# Patient Record
Sex: Female | Born: 1935 | Hispanic: No | Smoking: Never smoker
Health system: Southern US, Community
[De-identification: ages and names within clinical notes are randomized; demographics above are authoritative.]

## PROBLEM LIST (undated history)

## (undated) DIAGNOSIS — Z992 Dependence on renal dialysis: Secondary | ICD-10-CM

## (undated) DIAGNOSIS — N289 Disorder of kidney and ureter, unspecified: Secondary | ICD-10-CM

## (undated) DIAGNOSIS — M810 Age-related osteoporosis without current pathological fracture: Secondary | ICD-10-CM

## (undated) DIAGNOSIS — H409 Unspecified glaucoma: Secondary | ICD-10-CM

## (undated) DIAGNOSIS — IMO0002 Reserved for concepts with insufficient information to code with codable children: Secondary | ICD-10-CM

## (undated) DIAGNOSIS — H3530 Unspecified macular degeneration: Secondary | ICD-10-CM

## (undated) DIAGNOSIS — I4891 Unspecified atrial fibrillation: Secondary | ICD-10-CM

## (undated) DIAGNOSIS — I719 Aortic aneurysm of unspecified site, without rupture: Secondary | ICD-10-CM

## (undated) DIAGNOSIS — I509 Heart failure, unspecified: Secondary | ICD-10-CM

## (undated) DIAGNOSIS — J811 Chronic pulmonary edema: Secondary | ICD-10-CM

## (undated) DIAGNOSIS — I517 Cardiomegaly: Secondary | ICD-10-CM

## (undated) DIAGNOSIS — I10 Essential (primary) hypertension: Secondary | ICD-10-CM

## (undated) HISTORY — PX: PACEMAKER PLACEMENT: SHX43

## (undated) HISTORY — PX: EYE SURGERY: SHX253

---

## 2010-03-11 DIAGNOSIS — J811 Chronic pulmonary edema: Secondary | ICD-10-CM

## 2010-03-11 HISTORY — DX: Chronic pulmonary edema: J81.1

## 2010-04-10 ENCOUNTER — Ambulatory Visit: Payer: Self-pay | Admitting: Nephrology

## 2010-04-11 ENCOUNTER — Other Ambulatory Visit: Payer: Self-pay | Admitting: Nephrology

## 2010-04-11 ENCOUNTER — Ambulatory Visit: Payer: Self-pay | Admitting: Internal Medicine

## 2010-04-12 ENCOUNTER — Observation Stay: Payer: Self-pay | Admitting: Nephrology

## 2010-04-16 ENCOUNTER — Inpatient Hospital Stay: Payer: Self-pay | Admitting: Internal Medicine

## 2010-04-21 DIAGNOSIS — R0602 Shortness of breath: Secondary | ICD-10-CM

## 2010-05-10 ENCOUNTER — Ambulatory Visit: Payer: Self-pay | Admitting: Internal Medicine

## 2010-05-18 ENCOUNTER — Ambulatory Visit: Payer: Self-pay | Admitting: Internal Medicine

## 2010-06-10 ENCOUNTER — Ambulatory Visit: Payer: Self-pay | Admitting: Internal Medicine

## 2010-06-28 ENCOUNTER — Ambulatory Visit: Payer: Self-pay | Admitting: Family Medicine

## 2010-06-29 ENCOUNTER — Ambulatory Visit: Payer: Self-pay | Admitting: Vascular Surgery

## 2010-06-29 ENCOUNTER — Ambulatory Visit: Payer: Self-pay | Admitting: Family Medicine

## 2010-07-01 ENCOUNTER — Emergency Department: Payer: Self-pay | Admitting: Emergency Medicine

## 2010-07-18 ENCOUNTER — Encounter: Payer: Self-pay | Admitting: Family Medicine

## 2010-07-27 ENCOUNTER — Ambulatory Visit: Payer: Self-pay | Admitting: Vascular Surgery

## 2010-07-31 ENCOUNTER — Emergency Department: Payer: Self-pay | Admitting: Emergency Medicine

## 2010-08-04 ENCOUNTER — Ambulatory Visit: Payer: Self-pay

## 2010-08-10 ENCOUNTER — Encounter: Payer: Self-pay | Admitting: Family Medicine

## 2010-08-22 ENCOUNTER — Ambulatory Visit: Payer: Self-pay

## 2010-10-18 ENCOUNTER — Inpatient Hospital Stay: Payer: Self-pay | Admitting: Internal Medicine

## 2010-11-07 ENCOUNTER — Ambulatory Visit: Payer: Self-pay | Admitting: Internal Medicine

## 2012-08-20 LAB — CBC WITH DIFFERENTIAL/PLATELET
Basophil %: 1.7 %
Eosinophil %: 2.2 %
HGB: 10.7 g/dL — ABNORMAL LOW (ref 12.0–16.0)
Lymphocyte #: 1.8 10*3/uL (ref 1.0–3.6)
Lymphocyte %: 25 %
MCH: 32.8 pg (ref 26.0–34.0)
MCV: 99 fL (ref 80–100)
Monocyte #: 0.5 x10 3/mm (ref 0.2–0.9)
Neutrophil #: 4.7 10*3/uL (ref 1.4–6.5)
Neutrophil %: 64.5 %
Platelet: 160 10*3/uL (ref 150–440)
RBC: 3.26 10*6/uL — ABNORMAL LOW (ref 3.80–5.20)
RDW: 14.2 % (ref 11.5–14.5)
WBC: 7.2 10*3/uL (ref 3.6–11.0)

## 2012-08-20 LAB — COMPREHENSIVE METABOLIC PANEL
Alkaline Phosphatase: 86 U/L (ref 50–136)
BUN: 54 mg/dL — ABNORMAL HIGH (ref 7–18)
Bilirubin,Total: 0.5 mg/dL (ref 0.2–1.0)
Calcium, Total: 9.2 mg/dL (ref 8.5–10.1)
Chloride: 103 mmol/L (ref 98–107)
Co2: 26 mmol/L (ref 21–32)
Creatinine: 6.84 mg/dL — ABNORMAL HIGH (ref 0.60–1.30)
EGFR (African American): 6 — ABNORMAL LOW
Osmolality: 293 (ref 275–301)
Potassium: 4.4 mmol/L (ref 3.5–5.1)
SGPT (ALT): 26 U/L (ref 12–78)
Sodium: 139 mmol/L (ref 136–145)

## 2012-08-20 LAB — PRO B NATRIURETIC PEPTIDE: B-Type Natriuretic Peptide: 89492 pg/mL — ABNORMAL HIGH (ref 0–450)

## 2012-08-20 LAB — MAGNESIUM: Magnesium: 1.8 mg/dL

## 2012-08-20 LAB — TROPONIN I
Troponin-I: 0.04 ng/mL
Troponin-I: 0.06 ng/mL — ABNORMAL HIGH

## 2012-08-20 LAB — CK TOTAL AND CKMB (NOT AT ARMC): CK-MB: 1.7 ng/mL (ref 0.5–3.6)

## 2012-08-21 LAB — PHOSPHORUS: Phosphorus: 4.9 mg/dL (ref 2.5–4.9)

## 2012-08-21 LAB — CK TOTAL AND CKMB (NOT AT ARMC): CK-MB: 1.7 ng/mL (ref 0.5–3.6)

## 2012-08-21 LAB — TROPONIN I: Troponin-I: 0.05 ng/mL

## 2012-08-22 ENCOUNTER — Inpatient Hospital Stay: Payer: Self-pay | Admitting: Internal Medicine

## 2012-08-23 LAB — RENAL FUNCTION PANEL
BUN: 30 mg/dL — ABNORMAL HIGH (ref 7–18)
Calcium, Total: 8.5 mg/dL (ref 8.5–10.1)
Chloride: 101 mmol/L (ref 98–107)
EGFR (African American): 9 — ABNORMAL LOW
EGFR (Non-African Amer.): 8 — ABNORMAL LOW
Osmolality: 289 (ref 275–301)
Phosphorus: 2.8 mg/dL (ref 2.5–4.9)
Potassium: 3.9 mmol/L (ref 3.5–5.1)
Sodium: 140 mmol/L (ref 136–145)

## 2012-08-23 LAB — CBC WITH DIFFERENTIAL/PLATELET
Eosinophil #: 0.2 10*3/uL (ref 0.0–0.7)
HCT: 29.4 % — ABNORMAL LOW (ref 35.0–47.0)
Lymphocyte %: 28.6 %
MCHC: 33.5 g/dL (ref 32.0–36.0)
MCV: 98 fL (ref 80–100)
Neutrophil #: 3.6 10*3/uL (ref 1.4–6.5)
Neutrophil %: 56.2 %
RDW: 13.8 % (ref 11.5–14.5)
WBC: 6.4 10*3/uL (ref 3.6–11.0)

## 2013-01-29 ENCOUNTER — Emergency Department: Payer: Self-pay | Admitting: Emergency Medicine

## 2013-01-29 LAB — BASIC METABOLIC PANEL
Anion Gap: 5 — ABNORMAL LOW (ref 7–16)
BUN: 33 mg/dL — ABNORMAL HIGH (ref 7–18)
Calcium, Total: 9.3 mg/dL (ref 8.5–10.1)
Chloride: 104 mmol/L (ref 98–107)
Co2: 32 mmol/L (ref 21–32)
EGFR (African American): 8 — ABNORMAL LOW
EGFR (Non-African Amer.): 7 — ABNORMAL LOW
Glucose: 96 mg/dL (ref 65–99)
Potassium: 3.9 mmol/L (ref 3.5–5.1)

## 2013-01-29 LAB — CBC
MCH: 33.3 pg (ref 26.0–34.0)
MCHC: 33 g/dL (ref 32.0–36.0)
MCV: 101 fL — ABNORMAL HIGH (ref 80–100)
Platelet: 202 10*3/uL (ref 150–440)
RDW: 14 % (ref 11.5–14.5)
WBC: 7.1 10*3/uL (ref 3.6–11.0)

## 2013-01-29 LAB — TROPONIN I: Troponin-I: 0.04 ng/mL

## 2013-01-29 LAB — URINALYSIS, COMPLETE
Blood: NEGATIVE
Glucose,UR: NEGATIVE mg/dL (ref 0–75)
Nitrite: NEGATIVE
Protein: 30
Specific Gravity: 1.008 (ref 1.003–1.030)
Transitional Epi: 3

## 2013-01-30 LAB — URINE CULTURE

## 2013-07-09 ENCOUNTER — Emergency Department: Payer: Self-pay | Admitting: Emergency Medicine

## 2013-07-09 LAB — URINALYSIS, COMPLETE
BACTERIA: NONE SEEN
BILIRUBIN, UR: NEGATIVE
BLOOD: NEGATIVE
Glucose,UR: NEGATIVE mg/dL (ref 0–75)
Ketone: NEGATIVE
Leukocyte Esterase: NEGATIVE
NITRITE: NEGATIVE
Ph: 7 (ref 4.5–8.0)
Protein: 100
RBC,UR: 5 /HPF (ref 0–5)
SQUAMOUS EPITHELIAL: NONE SEEN
Specific Gravity: 1.01 (ref 1.003–1.030)
WBC UR: <1 /HPF (ref 0–5)

## 2013-07-09 LAB — CBC
HCT: 34 % — AB (ref 35.0–47.0)
HGB: 11.2 g/dL — ABNORMAL LOW (ref 12.0–16.0)
MCH: 33.7 pg (ref 26.0–34.0)
MCHC: 33.1 g/dL (ref 32.0–36.0)
MCV: 102 fL — AB (ref 80–100)
Platelet: 179 10*3/uL (ref 150–440)
RBC: 3.34 10*6/uL — ABNORMAL LOW (ref 3.80–5.20)
RDW: 14.5 % (ref 11.5–14.5)
WBC: 5.4 10*3/uL (ref 3.6–11.0)

## 2013-07-09 LAB — COMPREHENSIVE METABOLIC PANEL
ANION GAP: 7 (ref 7–16)
Albumin: 3.4 g/dL (ref 3.4–5.0)
Alkaline Phosphatase: 54 U/L
BILIRUBIN TOTAL: 0.5 mg/dL (ref 0.2–1.0)
BUN: 40 mg/dL — ABNORMAL HIGH (ref 7–18)
CALCIUM: 8 mg/dL — AB (ref 8.5–10.1)
CO2: 31 mmol/L (ref 21–32)
Chloride: 99 mmol/L (ref 98–107)
Creatinine: 5.1 mg/dL — ABNORMAL HIGH (ref 0.60–1.30)
EGFR (Non-African Amer.): 7 — ABNORMAL LOW
GFR CALC AF AMER: 8 — AB
GLUCOSE: 132 mg/dL — AB (ref 65–99)
Osmolality: 285 (ref 275–301)
Potassium: 3.8 mmol/L (ref 3.5–5.1)
SGOT(AST): 21 U/L (ref 15–37)
SGPT (ALT): 23 U/L (ref 12–78)
Sodium: 137 mmol/L (ref 136–145)
TOTAL PROTEIN: 7.1 g/dL (ref 6.4–8.2)

## 2013-07-09 LAB — MAGNESIUM: Magnesium: 1.7 mg/dL — ABNORMAL LOW

## 2013-07-09 LAB — TROPONIN I: Troponin-I: 0.03 ng/mL

## 2013-07-09 LAB — LIPASE, BLOOD: Lipase: 269 U/L (ref 73–393)

## 2013-07-15 ENCOUNTER — Ambulatory Visit: Payer: Self-pay | Admitting: Family Medicine

## 2013-07-18 ENCOUNTER — Inpatient Hospital Stay: Payer: Self-pay | Admitting: Internal Medicine

## 2013-07-18 LAB — COMPREHENSIVE METABOLIC PANEL
ALBUMIN: 2.9 g/dL — AB (ref 3.4–5.0)
ALT: 36 U/L (ref 12–78)
ANION GAP: 8 (ref 7–16)
Alkaline Phosphatase: 60 U/L
BUN: 32 mg/dL — ABNORMAL HIGH (ref 7–18)
Bilirubin,Total: 0.6 mg/dL (ref 0.2–1.0)
CALCIUM: 8.3 mg/dL — AB (ref 8.5–10.1)
CHLORIDE: 98 mmol/L (ref 98–107)
CREATININE: 4.61 mg/dL — AB (ref 0.60–1.30)
Co2: 31 mmol/L (ref 21–32)
EGFR (African American): 9 — ABNORMAL LOW
EGFR (Non-African Amer.): 8 — ABNORMAL LOW
Glucose: 113 mg/dL — ABNORMAL HIGH (ref 65–99)
Osmolality: 282 (ref 275–301)
POTASSIUM: 3.8 mmol/L (ref 3.5–5.1)
SGOT(AST): 26 U/L (ref 15–37)
SODIUM: 137 mmol/L (ref 136–145)
Total Protein: 6.8 g/dL (ref 6.4–8.2)

## 2013-07-18 LAB — URINALYSIS, COMPLETE
BLOOD: NEGATIVE
Bacteria: NONE SEEN
Bilirubin,UR: NEGATIVE
Glucose,UR: NEGATIVE mg/dL (ref 0–75)
Ketone: NEGATIVE
NITRITE: NEGATIVE
Ph: 7 (ref 4.5–8.0)
RBC,UR: 9 /HPF (ref 0–5)
SPECIFIC GRAVITY: 1.018 (ref 1.003–1.030)
Squamous Epithelial: <1
WBC UR: 2 /HPF (ref 0–5)

## 2013-07-18 LAB — CBC
HCT: 34.9 % — ABNORMAL LOW (ref 35.0–47.0)
HGB: 11.4 g/dL — ABNORMAL LOW (ref 12.0–16.0)
MCH: 33.8 pg (ref 26.0–34.0)
MCHC: 32.7 g/dL (ref 32.0–36.0)
MCV: 103 fL — AB (ref 80–100)
Platelet: 178 10*3/uL (ref 150–440)
RBC: 3.39 10*6/uL — ABNORMAL LOW (ref 3.80–5.20)
RDW: 14.8 % — AB (ref 11.5–14.5)
WBC: 7.3 10*3/uL (ref 3.6–11.0)

## 2013-07-18 LAB — CK TOTAL AND CKMB (NOT AT ARMC)
CK, Total: 19 U/L — ABNORMAL LOW
CK-MB: 1.1 ng/mL (ref 0.5–3.6)

## 2013-07-18 LAB — TROPONIN I
Troponin-I: 0.04 ng/mL
Troponin-I: 0.05 ng/mL
Troponin-I: 0.06 ng/mL — ABNORMAL HIGH

## 2013-07-18 LAB — CK-MB: CK-MB: 1.2 ng/mL (ref 0.5–3.6)

## 2013-07-19 LAB — TSH: Thyroid Stimulating Horm: 1.02 u[IU]/mL

## 2013-07-19 LAB — CBC WITH DIFFERENTIAL/PLATELET
Basophil #: 0.1 10*3/uL (ref 0.0–0.1)
Basophil %: 1 %
Eosinophil #: 0.2 10*3/uL (ref 0.0–0.7)
Eosinophil %: 2.3 %
HCT: 31 % — ABNORMAL LOW (ref 35.0–47.0)
HGB: 10.2 g/dL — ABNORMAL LOW (ref 12.0–16.0)
LYMPHS PCT: 18.7 %
Lymphocyte #: 1.3 10*3/uL (ref 1.0–3.6)
MCH: 34.2 pg — AB (ref 26.0–34.0)
MCHC: 33 g/dL (ref 32.0–36.0)
MCV: 104 fL — ABNORMAL HIGH (ref 80–100)
Monocyte #: 0.7 x10 3/mm (ref 0.2–0.9)
Monocyte %: 9.8 %
NEUTROS ABS: 4.9 10*3/uL (ref 1.4–6.5)
NEUTROS PCT: 68.2 %
Platelet: 161 10*3/uL (ref 150–440)
RBC: 2.99 10*6/uL — ABNORMAL LOW (ref 3.80–5.20)
RDW: 14.6 % — ABNORMAL HIGH (ref 11.5–14.5)
WBC: 7.1 10*3/uL (ref 3.6–11.0)

## 2013-07-19 LAB — BASIC METABOLIC PANEL
Anion Gap: 12 (ref 7–16)
BUN: 46 mg/dL — ABNORMAL HIGH (ref 7–18)
CALCIUM: 8.5 mg/dL (ref 8.5–10.1)
CREATININE: 5.57 mg/dL — AB (ref 0.60–1.30)
Chloride: 98 mmol/L (ref 98–107)
Co2: 27 mmol/L (ref 21–32)
EGFR (African American): 7 — ABNORMAL LOW
GFR CALC NON AF AMER: 6 — AB
GLUCOSE: 77 mg/dL (ref 65–99)
Osmolality: 285 (ref 275–301)
Potassium: 4.3 mmol/L (ref 3.5–5.1)
SODIUM: 137 mmol/L (ref 136–145)

## 2013-07-20 LAB — PHOSPHORUS: Phosphorus: 6.8 mg/dL — ABNORMAL HIGH (ref 2.5–4.9)

## 2013-07-23 LAB — CULTURE, BLOOD (SINGLE)

## 2013-08-23 ENCOUNTER — Ambulatory Visit: Payer: Self-pay | Admitting: Cardiology

## 2013-08-23 LAB — CBC WITH DIFFERENTIAL/PLATELET
BASOS PCT: 1.1 %
Basophil #: 0.1 10*3/uL (ref 0.0–0.1)
EOS ABS: 0.2 10*3/uL (ref 0.0–0.7)
Eosinophil %: 2.2 %
HCT: 33 % — AB (ref 35.0–47.0)
HGB: 10.6 g/dL — ABNORMAL LOW (ref 12.0–16.0)
LYMPHS ABS: 1.4 10*3/uL (ref 1.0–3.6)
Lymphocyte %: 20 %
MCH: 33.7 pg (ref 26.0–34.0)
MCHC: 32.2 g/dL (ref 32.0–36.0)
MCV: 105 fL — ABNORMAL HIGH (ref 80–100)
MONO ABS: 0.8 x10 3/mm (ref 0.2–0.9)
MONOS PCT: 11.3 %
NEUTROS ABS: 4.7 10*3/uL (ref 1.4–6.5)
NEUTROS PCT: 65.4 %
Platelet: 232 10*3/uL (ref 150–440)
RBC: 3.15 10*6/uL — ABNORMAL LOW (ref 3.80–5.20)
RDW: 17.3 % — ABNORMAL HIGH (ref 11.5–14.5)
WBC: 7.2 10*3/uL (ref 3.6–11.0)

## 2013-08-23 LAB — BASIC METABOLIC PANEL
Anion Gap: 10 (ref 7–16)
BUN: 55 mg/dL — AB (ref 7–18)
CALCIUM: 9.8 mg/dL (ref 8.5–10.1)
CHLORIDE: 98 mmol/L (ref 98–107)
Co2: 29 mmol/L (ref 21–32)
Creatinine: 6.73 mg/dL — ABNORMAL HIGH (ref 0.60–1.30)
EGFR (Non-African Amer.): 5 — ABNORMAL LOW
GFR CALC AF AMER: 6 — AB
Glucose: 136 mg/dL — ABNORMAL HIGH (ref 65–99)
OSMOLALITY: 291 (ref 275–301)
Potassium: 4.3 mmol/L (ref 3.5–5.1)
SODIUM: 137 mmol/L (ref 136–145)

## 2013-08-23 LAB — PROTIME-INR
INR: 1.1
PROTHROMBIN TIME: 14.4 s (ref 11.5–14.7)

## 2013-08-23 LAB — APTT: Activated PTT: 32.6 secs (ref 23.6–35.9)

## 2013-08-25 ENCOUNTER — Ambulatory Visit: Payer: Self-pay | Admitting: Cardiology

## 2014-07-01 NOTE — Discharge Summary (Signed)
PATIENT NAME:  Sarah BunnellATE, Panayiota H MR#:  [id number] DATE OF BIRTH:  08-08-1923  DATE OF ADMISSION:  08/22/2012 DATE OF DISCHARGE:  08/24/2012  PRIMARY CARE PHYSICIAN: Dr. Terance HartBronstein.    DISCHARGE DIAGNOSES:  1. Atypical chest pain, fluid overload, acute diastolic congestive heart failure, ejection fraction of 50% to 55%.  2. End-stage renal disease.  3. Hypertension.   CONDITION: Stable.   CODE STATUS: FULL CODE.   HOME MEDICATIONS: [facility] medication reconciliation [facility].    DIET: Low-sodium, renal diet.   ACTIVITY: As tolerated.   FOLLOWUP CARE: Follow up with PCP within 1 to 2 weeks. Follow up with Dr. Cherylann RatelLateef or Dr. Thedore MinsSingh within 1 to 2 weeks. The patient needs to continue hemodialysis.   REASON FOR ADMISSION: Chest pain.   HOSPITAL COURSE:  1. The patient is an 81 year old Caucasian female with a history of hypertension, ESRD, neuropathy who presented to the ED with chest pain on the left side, radiating to her shoulder. The patient developed this chest pain when she was connected to the dialysis machine. The patient received aspirin and nitroglycerin. Symptoms resolved. The patient was sent to the ED for further evaluation. For detailed history and physical examination, please refer to the admission note dictated Dr. Cherlynn KaiserSainani. On admission date, the patient had chest x-ray, showed cardiomegaly with interstitial pulmonary edema. BUN 54, creatinine 6.8, sodium 138, potassium 4, chloride 103. Troponin 0.02. The patient was admitted for chest pain and possible angina. The patient's troponin level was negative. Stress test was negative. The patient had no chest pain after admission, but the patient developed respiratory distress, hypoxia with O2 saturation 85%, so the patient was treated with Lasix and got an echocardiograph which showed ejection fraction 50% to 55%. The patient was also treated with oxygen 2 liters by nasal cannula. Her shortness of  breath has improved after hemodialysis. The patient possibly had fluid overload with acute CHF, diastolic dysfunction.   2. For hypertension, the patient has been treated with Coreg, losartan, and blood pressure has been stable.  3. For ESRD, the patient has been treated with hemodialysis.   The patient is off oxygen since yesterday. The patient has no complaints. Vital signs are stable. She is clinically stable and will be discharged to home today. Discussed the patient's discharge plan with the patient's daughter, Dr. Thedore MinsSingh and the case manager.   TIME SPENT: About 37 minutes.   ____________________________ Shaune PollackQing Dani Wallner, MD qc:gb D: 08/24/2012 17:12:23 ET T: 08/25/2012 04:10:12 ET JOB#: [id number]  cc: Shaune PollackQing Ahmeer Tuman, MD, <Dictator> Shaune PollackQING Havilah Topor MD ELECTRONICALLY SIGNED 08/25/2012 16:23

## 2014-07-01 NOTE — H&P (Signed)
PATIENT NAME:  Sarah BunnellATE, Laberta H MR#:  [id number] DATE OF BIRTH:  08-10-1923  DATE OF ADMISSION:  08/20/2012  PRIMARY CARE PHYSICIAN: Dr. Dorothey Basemanavid Bronstein  NEPHROLOGIST: Dr. Mosetta PigeonHarmeet Singh.   CHIEF COMPLAINT: Chest pain.   HISTORY OF PRESENT ILLNESS: This is an 81 year old female who presents to the hospital with chest pain located in the left side of her chest, also radiating to her shoulder on the left side. The patient apparently woke up in her usual state of health, was feeling fine, she went to dialysis today and right before they had her connected to the machine, she started developing some sharp chest pain. EMS was called and she was brought to the hospital. On route, the patient received 3 baby aspirin some sublingual nitroglycerin and her chest pain has since then resolved. The patient says that she has never had these kind of symptoms before. She did become a bit diaphoretic and also had some shortness of breath with the chest pain, but no nausea, no vomiting and no other associated symptoms. Given her cardiac risk factors  of hypertension end-stage renal disease, hospitalist services were contacted for further treatment and evaluation.   REVIEW OF SYSTEMS:  CONSTITUTIONAL: No documented fever. No weight gain or weight loss.  EYES: No blurred or double vision.  ENT: No tinnitus. No postnasal drip. No redness of the oropharynx.  RESPIRATORY: No cough, no wheeze, no hemoptysis, no dyspnea.  CARDIOVASCULAR: Positive chest pain. No orthopnea, no palpitations, no syncope.  GASTROINTESTINAL: No nausea, no vomiting, no diarrhea. No abdominal pain, no melena or hematochezia.  GENITOURINARY: No dysuria, no hematuria.  ENDOCRINE: No polyuria or nocturia. No heat or cold intolerance.   HEMATOLOGIC: No anemia, no bruising. No bleeding.  INTEGUMENTARY: No rashes. No lesions.  MUSCULOSKELETAL: No arthritis, no swelling. No gout.  NEUROLOGIC: No numbness. No tingling. No ataxia. No seizure-type  activity.  PSYCHIATRIC:  No anxiety, no insomnia. No ADD.   PAST MEDICAL HISTORY: Consistent with hypertension, glaucoma, neuropathy, history of end-stage renal disease on hemodialysis, Tuesday, Thursday, Saturday.   ALLERGIES: No known drug allergies.   SOCIAL HISTORY: No smoking. No alcohol abuse. No illicit drug abuse. Lives at home by herself.   FAMILY HISTORY: Father died from complications of myocardial infarction. Mother died from old age at age 79.   CURRENT MEDICATIONS: Ambien 5 mg 1 to 2 tabs at bedtime as needed, aspirin 81 mg daily, Coreg 6.25 mg b.i.d., Lasix 80 mg on Monday, Wednesday, Friday and Sunday, gabapentin 300 mg daily, latanoprost 0.005% eye drops one drop to both eyes daily, losartan 25 mg at bedtime, Nephrocaps 1 tab daily, sublingual nitroglycerin as needed, omeprazole 20 mg b.i.d., promethazine 12.5 mg q. 6 hours as needed, timolol 0.5% to both eyes daily and valsartan 160 mg daily.   PHYSICAL EXAMINATION: Presently is as follows:   VITAL SIGNS:  Are noted to be: Temperature 98.2, pulse 58, respirations 20, blood pressure 164/82, sats 93% on room air.  GENERAL: She is a pleasant appearing female in no apparent distress.  HEENT: Atraumatic, normocephalic. Extraocular muscles are intact. Pupils equal, reactive to light. Sclerae anicteric. No conjunctival injection. No pharyngeal erythema.  NECK: Supple. There is no jugular venous distention, no bruits, no lymphadenopathy or thyromegaly.  HEART: Regular rate and rhythm. She does have a 2 to 3 systolic ejection murmur heard at the right sternal border. No rubs, no clicks.  LUNGS: Clear to auscultation bilaterally. No rales, no rhonchi, no wheezes.  ABDOMEN: Soft, flat, nontender, nondistended.  Has good bowel sounds. No hepatosplenomegaly appreciated.  EXTREMITIES: No evidence of any cyanosis, clubbing or peripheral edema. Has +2 pedal and radial pulses bilaterally. She also has a left upper extremity AV fistula, which has  good bruit and good thrill.  NEUROLOGICAL: The patient is alert, awake and oriented x 3 with no focal motor or sensory deficits appreciated bilaterally.  SKIN: Moist and warm with no rashes appreciated.  LYMPHATIC: There is no cervical or axillary lymphadenopathy.   LABORATORY DATA, DIAGNOSTIC AND RADIOLOGIC DATA: Serum glucose of 115, BUN 54, creatinine 6.8, sodium 139, potassium 4.0, chloride 103, bicarbonate 26. LFTs are within normal limits. Troponin 0.04. White cell count 7.2, hemoglobin 10.7, hematocrit 32.5, platelet count 160.   The patient did have a chest x-ray done, which showed cardiomegaly with interstitial pulmonary edema.   A CT scan of the abdomen and pelvis done with contrast showing a 3.7 cm intraregional abdominal aortic aneurysm with no evidence of perianeurysmal leakage. She also was noted to have a left adnexal mass about 5 x 4 x 5 cm.   ASSESSMENT AND PLAN: This is an 81 year old female with a history of end-stage renal disease on hemodialysis, hypertension, glaucoma, GERD, neuropathy, who presents to the hospital with chest pain.  1.  Chest pain. The patient's symptoms are typical for angina. She does have significant risk factors given her end-stage renal disease and hypertension. She has never really had a cardiac work-up done in the past. For now, I will observe her overnight on telemetry, follow serial cardiac markers; her first set is negative, continue aspirin, continue Coreg, some sublingual nitroglycerin as needed, we will get a Lexiscan Myoview in the morning.  2.  End-stage renal disease on hemodialysis. We will get a nephrology consult. The patient gets dialyzed on Tuesday, Thursday, Saturday. She actually missed her dialysis today.  3. Hypertension. Continue Coreg, losartan and valsartan .  4.  Glaucoma. Continue timolol and latanoprost.  5.  Gastroesophageal reflux disease. Continue omeprazole.  6. Neuropathy. Continue Neurontin.  7.  Infrarenal abdominal aortic  aneurysm. The patient is clinically asymptomatic. She can be referred to vascular surgery as an outpatient.  8.  Adnexal mass. Also refer her to OB/GYN as an outpatient.   CODE STATUS: The patient is a full code.   TIME SPENT ON ADMISSION: 50 minutes    ____________________________ Rolly Pancake. Cherlynn Kaiser, MD vjs:cc D: 08/20/2012 18:12:40 ET T: 08/20/2012 18:48:12 ET JOB#: [id number]  cc: Rolly Pancake. Cherlynn Kaiser, MD, <Dictator> Houston Siren MD ELECTRONICALLY SIGNED 08/28/2012 20:39

## 2014-07-02 NOTE — Op Note (Signed)
PATIENT NAME:  Sarah Macias, Sarah Macias MR#:  [id number] DATE OF BIRTH:  07-18-1923  DATE OF PROCEDURE:  08/25/2013  PREPROCEDURE DIAGNOSIS: Sick sinus syndrome.   PROCEDURE: Single-chamber pacemaker implantation.  POSTPROCEDURE DIAGNOSIS:  Intermittent ventricular pacing.   INDICATION FOR PROCEDURE: The patient is an 81 year old female with history of bradycardia and atrial fibrillation. The patient was [facility] and on 07/18/2013 with heart failure and atrial fibrillation. The patient was discharged home on a 48-hour Holter monitor, which revealed predominant sinus bradycardia with a mean heart rate of 54 bpm. The patient had a 4.8 second pause, as well as a run of atrial fibrillation, which lasted approximately 10 hours. The procedure, risks, benefits and alternatives of permanent pacemaker implantation were explained to the patient and informed written consent was obtained.   DESCRIPTION OF PROCEDURE: She was brought to the operating room in a fasting state. The left pectoral region was prepped and draped in the usual sterile manner. A 6 cm incision was performed over the left pectoral region. Ventricular lead (Medtronic 5092/52 cm) was positioned to right ventricular apical septum. After proper thresholds were obtained, the lead was sutured in place. The lead was connected to a single-chamber rate responsive pacemaker generator (Medtronic Adapta ADSR1). The pacemaker pocket was irrigated by gentamicin and lubed with gentamicin solution. The pacemaker generator was positioned into pocket. The pocket was closed with 2-0 and 4-0 Vicryl, respectively. Steri-Strips and pressure dressing were applied.    ____________________________ Marcina MillardAlexander Paraschos, MD ap:ce D: 08/25/2013 13:33:34 ET T: 08/25/2013 14:35:42 ET JOB#: [id number]  cc: Marcina MillardAlexander Paraschos, MD, <Dictator> Marcina MillardALEXANDER PARASCHOS MD ELECTRONICALLY SIGNED 09/02/2013 8:28

## 2014-07-02 NOTE — Consult Note (Signed)
PATIENT NAME:  Sarah BunnellATE, Naylani H MR#:  [id number] DATE OF BIRTH:  07-23-1923  DATE OF CONSULTATION:  07/18/2013  REFERRING PHYSICIAN:   CONSULTING PHYSICIAN:  Dr. Juliene PinaMody.   REASON FOR CONSULTATION: Atrial fibrillation with rapid ventricular rate, bradycardia, heart failure, hypertension, end-stage renal disease, angina, and mitral insufficiency.   CHIEF COMPLAINT:  sob  HISTORY OF PRESENT ILLNESS:  This is an 81 year old female with mild dementia who has had irregular heartbeat, palpitations, shortness of breath, and was seen in the Emergency Room with atrial fibrillation with rapid ventricular rate, for which he has had in the past. The patient therefore was given intravenous diltiazem and converted to normal rhythm and then ended up with significant bradycardia.  There were no symptoms or syncope with bradycardia, but the patient does have some pulmonary edema by chest x-ray and heart failure-type symptoms, multifactorial in nature including end-stage renal disease on dialysis, hypertension, and mitral insufficiency as well as atrial fibrillation with rapid ventricular rate. The patient has had an EKG at this time showing sinus bradycardia with nonspecific ST-T wave changes and occasional preventricular contraction. The patient has had a troponin which is normal without evidence of myocardial infarction, hypertension has been controlled with appropriate medications and currently not on beta blocker. The patient has had mitral valve disease and history of congestive heart failure for which he has had an ejection fraction of 35% and global LV dysfunction with mitral insufficiency. The remainder of review of systems cannot be assessed well due to the patient's mild dementia.   PAST MEDICAL HISTORY:  1. End-stage renal disease.  2. Hypertension.  3. Heart failure.  4. Atrial fibrillation.  5. Mitral valve insufficiency.   FAMILY HISTORY: No family members with early onset of cardiovascular disease or  hypertension.   SOCIAL HISTORY:  She currently denies alcohol or tobacco use.   ALLERGIES: AS LISTED.   MEDICATIONS: As listed.   PHYSICAL EXAMINATION:  VITAL SIGNS: Blood pressure is 162/70 bilaterally. Heart rate is 70 upright, reclining, and regular.  GENERAL: She is a well-appearing female in no acute distress.  HEENT: No icterus, thyromegaly, ulcers, hemorrhage, or xanthelasma.  CARDIOVASCULAR: Irregularly irregular with slow rate with normal S1 and S2 and a 3/6 apical murmur consistent with mitral regurgitation. PMI is diffuse. Carotid upstroke normal without bruit. Jugular venous pressure is normal.  LUNGS: Have bibasilar crackles with decreased breath sounds.  ABDOMEN: Soft, nontender, without hepatosplenomegaly or masses. Abdominal aorta is normal-sized without bruit.  EXTREMITIES: 2+ femoral and trace dorsal pedal with trace lower extremity edema. No cyanosis, clubbing, or ulcers.  NEUROLOGIC: She is not oriented to time, place, and person with normal mood and affect.   ASSESSMENT: An 81 year old female with atrial fibrillation with rapid ventricular rate, acute on chronic systolic dysfunction, congestive heart failure, hypertension, anemia, end-stage renal disease with valvular heart disease needing further treatment options.   RECOMMENDATIONS:  1. No anticoagulation at this time due to advanced age, dementia, and concerns of bleeding risks with maintenance of normal rhythm.  2. Continue treatment with Lasix and/or other treatment with end-stage renal disease including dialysis for pulmonary edema.  3. Continue surveillance of bradycardia and no use of beta blocker at this time due to concerns of symptomatic bradycardia.  4. Echocardiogram for extent of LV systolic dysfunction and valvular heart disease contributing to above with adjustments of medications as necessary.  5. No further intervention of valvular heart disease, although contributing to her shortness of breath and  heart failure.  6. Further  consideration of ACE inhibitor for hypertension control and beta blocker cannot be used.  Further diagnostic testing and treatment options after above.      ____________________________ Lamar Blinks, MD bjk:dd D: 07/18/2013 17:09:30 ET T: 07/18/2013 23:14:05 ET JOB#: [id number]  cc: Lamar Blinks, MD, <Dictator> Lamar Blinks MD ELECTRONICALLY SIGNED 07/19/2013 8:51

## 2014-07-02 NOTE — H&P (Signed)
PATIENT NAME:  Sarah BunnellATE, Marney H MR#:  [id number] DATE OF BIRTH:  Jul 03, 1923  DATE OF ADMISSION:  07/18/2013  PRIMARY CARE PROVIDER: Dr. Terance HartBronstein.   EMERGENCY DEPARTMENT REFERRING PHYSICIAN: Dr. Shaune PollackLord.   CHIEF COMPLAINT: Chest pain. Lower abdominal pain.   HISTORY OF PRESENT ILLNESS: The patient is an 81 year old white female with previous history of end-stage renal disease. Has a history of hypertension, mitral valve regurg and tricuspid valve regurg. Also has history of left bundle branch block, who comes to the ED with complaint of having chest pain on the left side ongoing for one week. She describes it as a sharp type of pain. There is no worsening with activity. The only thing that sometimes makes it better is leaning forward. She also has associated shortness of breath. Prior to this episode that started about a week ago she has been having intermittent chest pains. She was evaluated in 2014 for chest pain, which was felt to be due to atypical chest pain. The patient also is complaining of having abdominal pain, which is in the left lower abdomen. She also has pain on the left upper side of her chest. She states that the pain is sharp as well. No association with eating. She had a CT scan of the abdomen and pelvis done, as well as CT scan of the chest done a few days ago by her primary care provider. CT of the abdomen did show some free air was noted in the gallbladder lumen and within the intrahepatic biliary tree and gallstones were noted. Today when she arrived in the ED, she had an ultrasound of the abdomen which did show gallstones with gallbladder wall thickening. The ED M.D. spoke to the surgeon on call, Dr. Egbert GaribaldiBird who recommended medical treatment with IV antibiotics. The patient has not had any fevers or chills. Also has been nauseous. Has not been had any emesis or diarrhea.   PAST MEDICAL HISTORY: Significant for hypertension, history of glaucoma, neuropathy, end-stage renal disease on  hemodialysis Tuesday, Thursday, Saturday, has mitral valve regurg, tricuspid regurg, has right atrial enlargement, and left atrial enlargement, and mild pulmonary hypertension, has a chronic left bundle branch block.   PAST SURGICAL HISTORY: Fistula for her hemodialysis access.   ALLERGIES: None.   SOCIAL HISTORY: No smoking. No alcohol. No drug use, lives at home by herself.   FAMILY HISTORY: Father died from complications of an myocardial infarction. Mother died as a result of natural aging at 6196.   CURRENT MEDICATIONS AT HOME: She is on vitamin D3 1000 international units daily, TUMS 100 mg 2 tabs t.i.d., tramadol 50 q.4 p.r.n., timolol 0.04% ophthalmic drop to affected eye q. daily,   1 tab p.o. daily. Prolia  60 mg one dose subcutaneously every six months, pro nutrients omega-3 2 capsules daily with food, Ocuvite 1 tab p.o. daily, melatonin 3 mg once at bedtime, losartan 25 mg 1 tab p.o. at bedtime, latanoprost 0.005% one drop to affected eyes at bedtime, Lasix 80 one tab p.o. Monday, Wednesday, Friday, carvedilol 6.25 one tabs p.o. every evening, aspirin 81 mg 1 tab p.o. daily.    REVIEW OF SYSTEMS:  CONSTITUTIONAL: Denies any fevers. The patient complains of chest pain, abdominal pain. No weight loss. No weight gain.  EYES: No blurred or double vision. No pain. No redness. Has glaucoma.  ENT: No tinnitus. No ear pain. No hearing loss. No seasonal or year-round allergies. No epistaxis. No difficulty swallowing.  RESPIRATORY: Denies any cough, wheezing, hemoptysis. No COPD.  CARDIOVASCULAR: Complains of chest pain. No edema, no arrhythmia.  GASTROINTESTINAL: Complains of nausea but no vomiting. No diarrhea, left upper quadrant abdominal pain as well as lower abdominal pain. No irritable bowel syndrome. No jaundice.  GENITOURINARY: Denies any dysuria, hematuria.  ENDOCRINOLOGY: Denies any polyuria, nocturia or thyroid problems.  HEMATOLOGIC AND LYMPHATIC: Denies anemia, easy bruisability or  bleeding.  SKIN: No acne. No rash. No changes in mole, hair or skin.  MUSCULOSKELETAL: Denies any pain in the neck, back or shoulder. NEUROLOGICAL: No numbness, CVA, TIA or seizures.  PSYCHIATRIC: No anxiety, insomnia or ADD.   PHYSICAL EXAMINATION: VITAL SIGNS: Temperature 97.5, pulse 121, respirations 18, blood pressure 141/84, O2 92%.  GENERAL: The patient is a well-developed, appears mildly uncomfortable.  HEENT: Head atraumatic, normocephalic. Pupils equally round, reactive to light and accommodation. There is no conjunctival pallor. No scleral icterus. Nasal exam shows no drainage or ulceration.  OROPHARYNX: Clear without any exudate.   NECK: Supple without any JVD.  CARDIOVASCULAR: Irregularly irregular rhythm. There is a systolic murmur heard at the upper sternal border. No gallops.  LUNGS: Clear to auscultation bilaterally without any rales, rhonchi, wheezing.  ABDOMEN: There is tenderness in the left suprapubic region. There is no right upper quadrant tenderness. No guarding.  SKIN: No rash.  LYMPHATICS: No lymph nodes palpable.  VASCULAR: Good DP, PT pulses.  PSYCHIATRIC: Not anxious or depressed.  NEUROLOGIC: Awake, alert, oriented x 3. No focal deficits.   LABORATORY, DIAGNOSTIC AND RADIOLOGICAL DATA: Glucose 113, BUN 32, creatinine 4.61, sodium 137, potassium 3.8, chloride 98, CO2 is 31. LFTs: Total protein 6.8, albumin 2.9, bilirubin total 0.6, alkaline phosphatase 60 AST is 26, ALT 36. Troponin 0.04. WBC 7.3, hemoglobin 11.4, platelet count 178.   Urinalysis: Nitrites negative, leukocytes negative   Ultrasound of the abdomen shows gallbladder wall thickening and multiple stones concerning for cholecystitis. Small amount of ascites present.   ASSESSMENT AND PLAN: The patient is an 81 year old white female with history of end-stage renal disease who presents with chest pain, lower abdominal pain.  1. Chest pain, possibly due to atrial fibrillation with rapid ventricular  response. At this time, we will do serial cardiac enzymes, echocardiogram. Cardiology consult. Has had a negative stress test last year.  2. Atrial fibrillation with rapid ventricular response, possibly related to acute cholecystitis. Heart rate is in the, 80s. We will do p.o. Cardizem,  hold aspirin for now.  We will consider long-term anticoagulation at discharge; however, in light of gallbladder issues may require surgery. We will hold off on full dose anticoagulation.  3. Possible acute cholecystitis. There is no Murphy's sign; however, her radiographic findings consistent with cholecystitis. I will get a HIDA scan and surgery consult. She will be on IV Zosyn for now.  4. End-stage renal disease. Hemodialysis per nephrology.  5. Hypertension. We will continue her regimen with losartan. 6. Glaucoma. We will continue home eyedrops.  7. Miscellaneous. The patient will be on heparin for deep vein thrombosis prophylaxis.  TIME SPENT: 55 minutes.    ____________________________ Lacie Scotts Allena Katz, MD shp:sg D: 07/18/2013 11:48:34 ET T: 07/18/2013 12:25:27 ET JOB#: [id number]  cc: Iman Reinertsen H. Allena Katz, MD, <Dictator> Charise Carwin MD ELECTRONICALLY SIGNED 07/20/2013 14:45

## 2014-07-02 NOTE — Discharge Summary (Signed)
Dates of Admission and Diagnosis:  Date of Admission 18-Jul-2013   Date of Discharge 21-Jul-2013   Admitting Diagnosis abd pain, chest pain   Final Diagnosis 1. Chronic cholecystitis 2. Left adnexal cyst 3. pAfib 4. Sinus bradycardia 5. Chronic systolic chf 6. Chronic resp failure 7. ESRD    Chief Complaint/History of Present Illness CHIEF COMPLAINT: Chest pain. Lower abdominal pain.   HISTORY OF PRESENT ILLNESS: The patient is an 81 year old white female with previous history of end-stage renal disease. Has a history of hypertension, mitral valve regurg and tricuspid valve regurg. Also has history of left bundle branch block, who comes to the ED with complaint of having chest pain on the left side ongoing for one week. She describes it as a sharp type of pain. There is no worsening with activity. The only thing that sometimes makes it better is leaning forward. She also has associated shortness of breath. Prior to this episode that started about a week ago she has been having intermittent chest pains. She was evaluated in 2014 for chest pain, which was felt to be due to atypical chest pain. The patient also is complaining of having abdominal pain, which is in the left lower abdomen. She also has pain on the left upper side of her chest. She states that the pain is sharp as well. No association with eating. She had a CT scan of the abdomen and pelvis done, as well as CT scan of the chest done a few days ago by her primary care provider. CT of the abdomen did show some free air was noted in the gallbladder lumen and within the intrahepatic biliary tree and gallstones were noted. Today when she arrived in the ED, she had an ultrasound of the abdomen which did show gallstones with gallbladder wall thickening. The ED M.D. spoke to the surgeon on call, Dr. Marina Gravel who recommended medical treatment with IV antibiotics. The patient has not had any fevers or chills. Also has been nauseous. Has not been had  any emesis or diarrhea.   Allergies:  NKDA: None  Thyroid:  11-May-15 05:04   Thyroid Stimulating Hormone 1.02 (0.45-4.50 (International Unit)  ----------------------- Pregnant patients have  different reference  ranges for TSH:  - - - - - - - - - -  Pregnant, first trimetser:  0.36 - 2.50 uIU/mL)  General Ref:  12-May-15 14:25   Parathyroid Hormone, Intact ========== TEST NAME ==========  ========= RESULTS =========  = REFERENCE RANGE =  PTH INTACT  PTH, Intact PTH, Intact                     [H  361 pg/mL            ]             [address]               [facility]            No: [id number]           [address] [place], [place] [address]           Lindon Romp, MD         [phone]   Result(s) reported on 21 Jul 2013 at 07:48AM.  Routine Chem:  11-May-15 05:04   Glucose, Serum 77  BUN  46  Creatinine (comp)  5.57  Sodium, Serum 137  Potassium, Serum 4.3  Chloride, Serum 98  CO2, Serum 27  Calcium (Total), Serum  8.5  Anion Gap 12  Osmolality (calc) 285  eGFR (African American)  7  eGFR (Non-African American)  6 (eGFR values <91m/min/1.73 m2 may be an indication of chronic kidney disease (CKD). Calculated eGFR is useful in patients with stable renal function. The eGFR calculation will not be reliable in acutely ill patients when serum creatinine is changing rapidly. It is not useful in  patients on dialysis. The eGFR calculation may not be applicable to patients at the low and high extremes of body sizes, pregnant women, and vegetarians.)  12-May-15 14:25   Phosphorus, Serum  6.8 (Result(s) reported on 20 Jul 2013 at 03:13PM.)  Routine Hem:  11-May-15 05:04   WBC (CBC) 7.1  RBC (CBC)  2.99  Hemoglobin (CBC)  10.2  Hematocrit (CBC)  31.0  Platelet Count (CBC) 161  MCV  104  MCH  34.2  MCHC 33.0  RDW  14.6  Neutrophil % 68.2  Lymphocyte % 18.7  Monocyte % 9.8  Eosinophil % 2.3  Basophil % 1.0  Neutrophil # 4.9  Lymphocyte # 1.3   Monocyte # 0.7  Eosinophil # 0.2  Basophil # 0.1 (Result(s) reported on 19 Jul 2013 at 05:44AM.)   Pertinent Past History:  Pertinent Past History PAST MEDICAL HISTORY: Significant for hypertension, history of glaucoma, neuropathy, end-stage renal disease on hemodialysis Tuesday, Thursday, Saturday, has mitral valve regurg, tricuspid regurg, has right atrial enlargement, and left atrial enlargement, and mild pulmonary hypertension, has a chronic left bundle branch block.   Hospital Course:  Hospital Course 873f with CP and abd pain  * Chronic cholecystitis was On IV abx.d/c ed feels better. Surgery consulted. no surgery advised  * Bradycardia Pt does have pAfib and now in NSR. But being on metoprolol she is bradycadic into 30s 40s. Will Hold BB as her Tachycardia was likely from the pain.  * CP likely from Afib RVR. Cadiology was on board. Troponin no significant elevation  * Chronic systolic chf EF 415%No signs of fluid overload at this time. No BB due to Bradycardia. On ARB-Losartan  * Left adnexal cyst. [facility] Will get Pelvic [place] * AOCD  * ESRD - hd nephrology HD today Noon  * htn continue home tx   Day of d/c s1,s2 lubs - cta ext- no edema  Time spent on day d/c 35 min   Condition on Discharge Fair   Code Status:  Code Status Full Code   DISCHARGE INSTRUCTIONS HOME MEDS:  Medication Reconciliation: Patient's Home Medications at Discharge:     Medication Instructions  aspirin 81 mg oral tablet  1 tab(s) orally once a day   pro nutrients omega 3 minigels  2 minigels orally once a day with food.   losartan 25 mg oral tablet  1 tab(s) orally once a day (at bedtime)   ocuvite antioxidant multiple vitamins and minerals oral capsule  1 cap(s) orally once a day   tums 1000 mg oral tablet, chewable  2 tab(s) orally 3 times a day (with meals)   reno caps vitamin b complex with c and folic acid oral capsule  1 cap(s) orally once a  day with dinner   melatonin 3 mg oral tablet  1 tab(s) orally once (at bedtime)   prolia 60 mg/ml subcutaneous solution  1 dose(s) subcutaneous every 6 months   tramadol 50 mg oral tablet  1 tab(s) orally every 4 hours, As Needed - for Pain   furosemide 80 mg  oral tablet  1 tab(s) orally Monday, Wednesday, and Friday   latanoprost 0.005% ophthalmic solution  1 drop(s) to each affected eye once a day (at bedtime)   timolol maleate 0.5% ophthalmic solution  1 drop(s) to each affected eye once a day (in the morning)   vitamin d3 1000 intl units oral capsule  1 cap(s) orally once a day (in the evening)   carvedilol 3.125 mg oral tablet  1 tab(s) orally once a day     Physician's Instructions:  Home Health? Yes   [place] Instructions ambulation - walker, new home 02, CP assess   Home Oxygen? Yes   Portable Tank? Yes   Oxygen delivery at home: 2L   Diet Renal Diet   Activity Limitations As tolerated   Return to Work Not Applicable   Time frame for Follow Up Appointment 1-2 weeks  Dr. Irwin Brakeman   Electronic Signatures: Onalee Steinbach, Lottie Dawson (MD)  (Signed 18-May-15 16:15)  Authored: ADMISSION DATE AND DIAGNOSIS, CHIEF COMPLAINT/HPI, Allergies, PERTINENT LABS, [place] MEDS, PATIENT INSTRUCTIONS   Last Updated: 18-May-15 16:15 by Alba Destine (MD)

## 2014-07-02 NOTE — H&P (Signed)
PATIENT NAME:  Sarah Macias, Sarah Macias MR#:  [id number] DATE OF BIRTH:  11-28-1923  DATE OF ADMISSION:  07/18/2013  SURGERY CONSULTATION NOTE  HISTORY OF PRESENT ILLNESS: This 81 year old female, a patient of Dr. Mcarthur Rossetti, was admitted emergently with a chief complaint of pain in the left flank. She has had some pain in this area for a number of weeks last fall and also had a pain approximately May 1 and she had presented to the Emergency Room with pain and at that time had a CT scan, which demonstrated air in her gallbladder, some thickening of the gallbladder wall, also an approximately 4-cm abdominal aortic aneurysm, a 6-cm left adnexal cyst. She was able to return home, but was readmitted yesterday with recurring left flank pain which was fairly steady pain. She had some associated anorexia, some minimal degree of nausea, but no vomiting. She reported no associated chills or fever. She reports that she has been moving her bowels satisfactorily, has not been constipated, has not been passing blood. She reports voiding and has not noted any blood in her urine. She did have some evaluation including ultrasound, which demonstrated gallstones and some thickening of the gallbladder wall. However, at the time of ultrasound no air was identified in the gallbladder. She also has had hepatobiliary scan which did demonstrate gallbladder function.   She was admitted to the hospital and placed on IV fluids and intravenous Zosyn. She reports improvement today, having no abdominal pain today. She has been able to tolerate a clear liquid diet and currently in no acute distress.   PAST MEDICAL HISTORY: Includes:  1. A 3-year history of chronic renal failure and dialysis. This was related to a history of glomerulonephritis. She has an AV fistula in the left upper arm and having renal dialysis on Tuesday, Thursday, and Fridays. 2. Also has a history of chronic anemia.  3. A history of spinal stenosis and back pain and  osteoporosis.  4. History of glaucoma. 5. History of cataracts. 6. A past history of pneumonia.  7. A review of cardiology notes indicates valvular heart disease with mitral insufficiency and atrial fibrillation.  8. Hypertension.  9. Hyperlipidemia. 10. Echocardiogram finding of global left ventricular systolic dysfunction with ejection fraction of 35%.    PAST SURGICAL HISTORY: Includes: Tonsillectomy in 1943, cataract surgery both eyes 2003, creation of left arm fistula May 2012. She reports appendectomy when she was a teenager.   MEDICATIONS: Include:  1. Aspirin 81 mg daily.  2. Carvedilol 6.25 mg daily.  3. Furosemide 80 mg Monday, Wednesday, Friday. 4. Latanoprost 0.005% one drop each eye at bedtime. 5. Losartan 25 mg daily. 6. Melatonin 3 mg bedtime. 7. Ocuvite antioxidant.  8. Multiple vitamins with minimals daily. 9. ProNutrients omega-3 mini gels 2 daily. 10. Prolia 60 mg/mL subcutaneous solution 1 dose every 6 months.  11. Renal Caps. 12. Vitamin B complex with C. 13. Folic acid daily. 14. Timolol 0.5% each eye once per day.  15. Recent tramadol 50 mg each 4 hours as needed for pain.  16. Tums 1000 mg 2 tablets 3 times per day.  17. Vitamin D3 at 1000 international units daily.   DRUG ALLERGIES: None known.   SOCIAL HISTORY: She does not smoke, does not drink any alcohol. She is accompanied by her daughter-in-law and brother.   FAMILY HISTORY: Positive for heart disease. Also colon cancer.   REVIEW OF SYSTEMS: She reports that she generally does a limited amount of walking, mostly inside. Does sometimes go  with assistance to grocery store, does use a cane to help with balance. She reports no associated chills or fever and there is no history of jaundice. She reports that she did get some slight nausea and some degree of anorexia with limited oral intake. Did take clear liquids for supper this evening. She reports recent vision has been good. She reports no substantial  hearing loss. She did have some mild difficulty breathing on exertion and on admission and was placed on some oxygen at the time of admission which she is breathing better today.  1. Has had some minor discomfort in the upper chest.  2. She does report she passes some urine and has been moving her bowels satisfactorily. She reports no recent sores or boils. She reports no ankle edema. She does report some history of low back pain and the recurring left flank pains and also some lower abdominal discomfort.  Review of systems otherwise negative.   PHYSICAL EXAMINATION:  VITAL SIGNS: Temperature 97.9. Her current pulse rate is regular. Her pulse rate 44, respiratory rate 16, blood pressure 130/64, oxygen saturation 96% on 2 liters of oxygen.  GENERAL: She is resting in the hospital bed. SKIN: Without jaundice or rash.  HEENT: Pupils equal, reactive to light. Extraocular movements are intact. Palpebral conjunctivae slightly pale. Sclerae clear. Pharynx appears slightly dry and slightly pale.  NECK: With no palpable mass or adenopathy.  LUNGS: Sounds were clear. No current respiratory distress.  HEART: Regular rhythm, S1, S2 with grade 2 systolic murmur heard best in the upper sternal area  ABDOMEN: Soft and flat and nontender with no palpable mass.  EXTREMITIES: No deep pedal edema. Does have a functioning AV fistula in the left upper arm with a markedly dilated cephalic vein and a palpable thrill.  NEUROLOGIC: Awake, alert, and oriented, moving all extremities.   CLINICAL DATA: Her glucose was slightly elevated on admission at 113. Her BUN today is 46 with a creatinine of 5.57. Her liver panel on admission with a slightly low albumin of 2.9. Other liver enzymes are normal. Bilirubin 0.6. Cardiac enzymes were noted. CBC with a white blood count of 7100, hemoglobin 10.2 with an MCV of 104. Her urinalysis on admission was with nine red blood cells per high powered field. No bacteria seen. She had 2 blood  cultures which are no growth. I reviewed CT scan which was done on April 1. This demonstrate air in the gallbladder, a 4-cm abdominal aortic aneurysm, a 6-cm left adnexal cyst. Radiologist report a tiny calcification in the right kidney which was nonobstructive.  Her ultrasound done yesterday showed gallstones and thickening of the gallbladder wall, hepatobiliary scan with gallbladder function.   IMPRESSION:  1. A left flank pain currently of undetermined etiology, could be related to spinal stenosis, could be related to a large left adnexal cyst. 2. Small size infrarenal abdominal aortic aneurysm.  3. Chronic cholecystitis, cholelithiasis. 4. Chronic renal failure with hemodialysis. 5. Valvular heart disease with atrial fibrillation and decreased left ventricular ejection fraction.  6. Anemia  I discussed these problems with her at length. I think at present it is unclear as to the cause of her left flank pain and may be another cause other than gallbladder disease. I think her gallbladder disease may be chronic.  I do not think she clearly needs gallbladder surgery at present. Her further evaluation could include gastroenterology consultation and/or gynecology consultation.   I am going to order a renal diet for breakfast. I suggested that  she attempts some walking with assistants while in the hospital. I will plan to follow her while in the hospital as well.    ____________________________ J. Renda RollsWilton Smith, MD jws:lt D: 07/19/2013 20:35:00 ET T: 07/19/2013 23:21:31 ET JOB#: [id number]  cc: Adella HareJ. Wilton Smith, MD, <Dictator> Teena Iraniavid M. Terance HartBronstein, MD Adella HareWILTON J SMITH MD ELECTRONICALLY SIGNED 07/20/2013 10:40

## 2014-07-27 ENCOUNTER — Emergency Department
Admission: EM | Admit: 2014-07-27 | Discharge: 2014-07-27 | Disposition: A | Discharge status: Home / Self Care | Payer: Medicare Other | Attending: Emergency Medicine | Admitting: Emergency Medicine

## 2014-07-27 ENCOUNTER — Encounter: Payer: Self-pay | Admitting: Emergency Medicine

## 2014-07-27 DIAGNOSIS — Y841 Kidney dialysis as the cause of abnormal reaction of the patient, or of later complication, without mention of misadventure at the time of the procedure: Secondary | ICD-10-CM | POA: Insufficient documentation

## 2014-07-27 DIAGNOSIS — N186 End stage renal disease: Secondary | ICD-10-CM | POA: Diagnosis not present

## 2014-07-27 DIAGNOSIS — Z992 Dependence on renal dialysis: Secondary | ICD-10-CM | POA: Insufficient documentation

## 2014-07-27 DIAGNOSIS — T82590A Other mechanical complication of surgically created arteriovenous fistula, initial encounter: Secondary | ICD-10-CM | POA: Insufficient documentation

## 2014-07-27 DIAGNOSIS — T829XXA Unspecified complication of cardiac and vascular prosthetic device, implant and graft, initial encounter: Secondary | ICD-10-CM

## 2014-07-27 HISTORY — DX: Dependence on renal dialysis: Z99.2

## 2014-07-27 MED ORDER — LIDOCAINE-EPINEPHRINE (PF) 1 %-1:200000 IJ SOLN
INTRAMUSCULAR | Status: AC
  Filled 2014-07-27: qty 30

## 2014-07-27 MED ORDER — LIDOCAINE HCL (PF) 1 % IJ SOLN: 2.0000 mL | Freq: Once | INTRAMUSCULAR | Status: AC

## 2014-07-27 MED ORDER — LIDOCAINE HCL (PF) 1 % IJ SOLN: 2.0000 mL | Freq: Once | INTRAMUSCULAR | Status: DC

## 2014-07-27 NOTE — ED Notes (Signed)
EDP stitched a figure 8 stitch above the site of the fistula. The site was covered with xeroform and wrapped with an ace bandage. The stitch appeared to have stopped the bleeding.

## 2014-07-27 NOTE — ED Notes (Signed)
Pt dialysis catheter started bleeding after treatment yesterday and has continue to get worse. Dialysis center advised pt to come to ED.

## 2014-07-27 NOTE — Discharge Instructions (Signed)
Dialysis Vascular Access Malfunction °A vascular access is an entrance to your blood vessels that can be used for dialysis. A vascular access can be made in one of several ways:  °· Joining an artery to a vein under your skin to make a bigger blood vessel called a fistula.   °· Joining an artery to a vein under your skin using a soft tube called a graft.   °· Placing a thin, flexible tube (catheter) in a large vein, usually in your neck.   °A vascular access may malfunction or become blocked.  °WHAT CAN CAUSE YOUR VASCULAR ACCESS TO MALFUNCTION? °· Infection (common).   °· A blood clot inside a part of the fistula, graft, or catheter. A blood clot can completely or partially block the flow of blood.   °· A kink in the graft or catheter.   °· A collection of blood (called a hematoma or bruise) next to the graft or catheter that pushes against it, blocking the flow of blood.   °WHAT ARE SIGNS AND SYMPTOMS OF VASCULAR ACCESS MALFUNCTION? °· There is a change in the vibration or pulse of your fistula or graft. °· The vibration or pulse of your fistula or graft is gone.   °· There is new or unusual swelling of the area around the access.   °· There was an unsuccessful puncture of your access by the dialysis team.   °· The flow of blood through the fistula, graft, or catheter is too slow for effective dialysis.   °· When routine dialysis is completed and the needle is removed, bleeding lasts for too long a time.   °WHAT HAPPENS IF MY VASCULAR ACCESS MALFUNCTIONS? °Your health care provider may order blood work, cultures, or an X-ray test in order to learn what may be wrong with your vascular access. The X-ray test involves the injection of a liquid into the vascular access. The liquid shows up on the X-ray and allows your health care provider to see if there is a blockage in the vascular access.  °Treatment varies depending on the cause of the malfunction:   °· If the vascular access is infected, your health care provider  may prescribe antibiotic medicine to control the infection.   °· If a clot is found in the vascular access, you may need surgery to remove the clot.   °· If a blockage in the vascular access is due to some other cause (such as a kink in a graft), then you will likely need surgery to unblock or replace the graft.   °HOME CARE INSTRUCTIONS: °Follow up with your surgeon or other health care provider if you were instructed to do so. This is very important. Any delay in follow-up could cause permanent dysfunction of the vascular access, which may be dangerous.  °SEEK MEDICAL CARE IF:  °· Fever develops.   °· Swelling and pain around the vascular access gets worse or new pain develops. °· Pain, numbness, or an unusual pale skin color develops in the hand on the side of your vascular access. °SEEK IMMEDIATE MEDICAL CARE IF: °Unusual bleeding develops at the location of the vascular access. °MAKE SURE YOU: °· Understand these instructions. °· Will watch your condition. °· Will get help right away if you are not doing well or get worse. °Document Released: 01/28/2006 Document Revised: 07/12/2013 Document Reviewed: 07/30/2012 °Exit[employer]® Patient Information ©2015 [employer]. This information is not intended to replace advice given to you by your health care provider. Make sure you discuss any questions you have with your health care provider. ° °

## 2014-07-27 NOTE — ED Provider Notes (Signed)
[facility] Emergency Department Provider Note     Time seen: 11:21 AM  I have reviewed the triage vital signs and the nursing notes.   HISTORY  Chief Complaint Vascular Access Problem    HPI Sarah Macias is a 81 y.o. female since ER for bleeding from her left AV fistula site. Patient had dialysis yesterday and she has bled since then. It has continued to get worse, I believe she received an extra dose of heparin yesterday.The dialysis Center advised her to come to the ER, she denies any pain.   Past Medical History  Diagnosis Date  . Dialysis patient     There are no active problems to display for this patient.   No past surgical history on file.  No current outpatient prescriptions on file.  Allergies Review of patient's allergies indicates no known allergies.  No family history on file.  Social History History  Substance Use Topics  . Smoking status: Never Smoker   . Smokeless tobacco: Not on file  . Alcohol Use: No     Review of Systems Constitutional: Negative for fever.  Skin: Positive for bleeding   10-point ROS otherwise negative.  ____________________________________________   PHYSICAL EXAM:  VITAL SIGNS: ED Triage Vitals  Enc Vitals Group     BP 07/27/14 1058 126/48 mmHg     Pulse Rate 07/27/14 1058 117     Resp 07/27/14 1058 18     Temp 07/27/14 1058 97.7 F (36.5 C)     Temp Source 07/27/14 1058 Oral     SpO2 07/27/14 1058 97 %     Weight 07/27/14 1058 115 lb (52.164 kg)     Height 07/27/14 1058 5' (1.524 m)     Head Cir --      Peak Flow --      Pain Score --      Pain Loc --      Pain Edu? --      Excl. in GC? --     Constitutional: Alert and oriented. Well appearing and in no distress. Musculoskeletal: Nontender with normal range of motion in all extremities. No joint effusions.  No lower extremity tenderness nor edema. Neurologic:  Normal speech and language. Skin:  persistent mild bleeding is  noted about the left AV fistula, from the puncture site. Psychiatric: Mood and affect are normal. Speech and behavior are normal. Patient exhibits appropriate insight and judgment.  ____________________________________________    LABS (pertinent positives/negatives)  Labs Reviewed - No data to display  ____________________________________________  ED COURSE:  Pertinent labs & imaging results that were available during my care of the patient were reviewed by me and considered in my medical decision making (see chart for details). LACERATION REPAIR Performed by: Emily FilbertWilliams, Jonathan E Authorized by: Daryel NovemberWilliams, Jonathan E Consent: Verbal consent obtained. Risks and benefits: risks, benefits and alternatives were discussed Consent given by: patient Patient identity confirmed: provided demographic data Prepped and Draped in normal sterile fashion Wound explored  Laceration Location: Left biceps area  Laceration Length: Puncture wound  No Foreign Bodies seen or palpated  Anesthesia: local infiltration  Local anesthetic: lidocaine 1 % with epinephrine  Anesthetic total: 3 ml  Irrigation method: syringe Amount of cleaning: standard  Skin closure: 6-0 Vicryl   Number of sutures: 1 figure-of-eight stitch   Technique: Figure-of-eight   Patient tolerance: Patient tolerated the procedure well with no immediate complications.  ____________________________________________    FINAL ASSESSMENT AND PLAN  Bleeding AV fistula  Plan: I closed the skin overlying this puncture wound after anesthetizing with lidocaine with epinephrine. Wound was closed and had satisfactory hemostasis. She was observed in the ER for another hour, without any rebleeding. She is stable for discharge    Emily FilbertWilliams, Jonathan E, MD   Emily FilbertJonathan E Williams, MD 07/27/14 [phone]

## 2014-11-19 ENCOUNTER — Emergency Department: Payer: Medicare Other

## 2014-11-19 ENCOUNTER — Encounter: Payer: Self-pay | Admitting: Emergency Medicine

## 2014-11-19 ENCOUNTER — Other Ambulatory Visit: Payer: Self-pay

## 2014-11-19 ENCOUNTER — Observation Stay
Admission: EM | Admit: 2014-11-19 | Discharge: 2014-11-20 | Disposition: A | Discharge status: Home / Self Care | Payer: Medicare Other | Attending: Specialist | Admitting: Specialist

## 2014-11-19 DIAGNOSIS — I517 Cardiomegaly: Secondary | ICD-10-CM | POA: Insufficient documentation

## 2014-11-19 DIAGNOSIS — I509 Heart failure, unspecified: Secondary | ICD-10-CM | POA: Diagnosis not present

## 2014-11-19 DIAGNOSIS — R0602 Shortness of breath: Secondary | ICD-10-CM | POA: Diagnosis not present

## 2014-11-19 DIAGNOSIS — Z888 Allergy status to other drugs, medicaments and biological substances status: Secondary | ICD-10-CM | POA: Diagnosis not present

## 2014-11-19 DIAGNOSIS — R748 Abnormal levels of other serum enzymes: Secondary | ICD-10-CM | POA: Diagnosis not present

## 2014-11-19 DIAGNOSIS — Z992 Dependence on renal dialysis: Secondary | ICD-10-CM | POA: Diagnosis not present

## 2014-11-19 DIAGNOSIS — Z79899 Other long term (current) drug therapy: Secondary | ICD-10-CM | POA: Insufficient documentation

## 2014-11-19 DIAGNOSIS — R112 Nausea with vomiting, unspecified: Secondary | ICD-10-CM | POA: Diagnosis not present

## 2014-11-19 DIAGNOSIS — J810 Acute pulmonary edema: Secondary | ICD-10-CM | POA: Diagnosis not present

## 2014-11-19 DIAGNOSIS — H3530 Unspecified macular degeneration: Secondary | ICD-10-CM | POA: Diagnosis not present

## 2014-11-19 DIAGNOSIS — E875 Hyperkalemia: Secondary | ICD-10-CM | POA: Insufficient documentation

## 2014-11-19 DIAGNOSIS — R42 Dizziness and giddiness: Secondary | ICD-10-CM | POA: Diagnosis not present

## 2014-11-19 DIAGNOSIS — I495 Sick sinus syndrome: Secondary | ICD-10-CM | POA: Diagnosis not present

## 2014-11-19 DIAGNOSIS — I482 Chronic atrial fibrillation: Secondary | ICD-10-CM | POA: Diagnosis not present

## 2014-11-19 DIAGNOSIS — R778 Other specified abnormalities of plasma proteins: Secondary | ICD-10-CM | POA: Diagnosis present

## 2014-11-19 DIAGNOSIS — N186 End stage renal disease: Secondary | ICD-10-CM | POA: Diagnosis not present

## 2014-11-19 DIAGNOSIS — M810 Age-related osteoporosis without current pathological fracture: Secondary | ICD-10-CM | POA: Diagnosis not present

## 2014-11-19 DIAGNOSIS — Z7982 Long term (current) use of aspirin: Secondary | ICD-10-CM | POA: Diagnosis not present

## 2014-11-19 DIAGNOSIS — R001 Bradycardia, unspecified: Secondary | ICD-10-CM | POA: Diagnosis present

## 2014-11-19 DIAGNOSIS — K8020 Calculus of gallbladder without cholecystitis without obstruction: Secondary | ICD-10-CM | POA: Diagnosis not present

## 2014-11-19 DIAGNOSIS — I132 Hypertensive heart and chronic kidney disease with heart failure and with stage 5 chronic kidney disease, or end stage renal disease: Secondary | ICD-10-CM | POA: Insufficient documentation

## 2014-11-19 DIAGNOSIS — H409 Unspecified glaucoma: Secondary | ICD-10-CM | POA: Diagnosis not present

## 2014-11-19 DIAGNOSIS — I719 Aortic aneurysm of unspecified site, without rupture: Secondary | ICD-10-CM | POA: Diagnosis not present

## 2014-11-19 DIAGNOSIS — D631 Anemia in chronic kidney disease: Secondary | ICD-10-CM | POA: Insufficient documentation

## 2014-11-19 DIAGNOSIS — Z950 Presence of cardiac pacemaker: Secondary | ICD-10-CM | POA: Insufficient documentation

## 2014-11-19 DIAGNOSIS — R7989 Other specified abnormal findings of blood chemistry: Secondary | ICD-10-CM

## 2014-11-19 HISTORY — DX: Disorder of kidney and ureter, unspecified: N28.9

## 2014-11-19 HISTORY — DX: Reserved for concepts with insufficient information to code with codable children: IMO0002

## 2014-11-19 HISTORY — DX: Aortic aneurysm of unspecified site, without rupture: I71.9

## 2014-11-19 HISTORY — DX: Unspecified macular degeneration: H35.30

## 2014-11-19 HISTORY — DX: Unspecified glaucoma: H40.9

## 2014-11-19 HISTORY — DX: Heart failure, unspecified: I50.9

## 2014-11-19 HISTORY — DX: Cardiomegaly: I51.7

## 2014-11-19 HISTORY — DX: Unspecified atrial fibrillation: I48.91

## 2014-11-19 HISTORY — DX: Chronic pulmonary edema: J81.1

## 2014-11-19 HISTORY — DX: Age-related osteoporosis without current pathological fracture: M81.0

## 2014-11-19 HISTORY — DX: Essential (primary) hypertension: I10

## 2014-11-19 LAB — COMPREHENSIVE METABOLIC PANEL
ALT: 19 U/L (ref 14–54)
ANION GAP: 16 — AB (ref 5–15)
AST: 27 U/L (ref 15–41)
Albumin: 3.2 g/dL — ABNORMAL LOW (ref 3.5–5.0)
Alkaline Phosphatase: 72 U/L (ref 38–126)
BUN: 59 mg/dL — AB (ref 6–20)
CO2: 30 mmol/L (ref 22–32)
Calcium: 8.9 mg/dL (ref 8.9–10.3)
Chloride: 93 mmol/L — ABNORMAL LOW (ref 101–111)
Creatinine, Ser: 6.27 mg/dL — ABNORMAL HIGH (ref 0.44–1.00)
GFR calc Af Amer: 6 mL/min — ABNORMAL LOW (ref >60)
GFR, EST NON AFRICAN AMERICAN: 5 mL/min — AB (ref >60)
Glucose, Bld: 112 mg/dL — ABNORMAL HIGH (ref 65–99)
POTASSIUM: 4.3 mmol/L (ref 3.5–5.1)
Sodium: 139 mmol/L (ref 135–145)
TOTAL PROTEIN: 7.1 g/dL (ref 6.5–8.1)
Total Bilirubin: 1.2 mg/dL (ref 0.3–1.2)

## 2014-11-19 LAB — TROPONIN I
Troponin I: 0.1 ng/mL — ABNORMAL HIGH (ref <0.031)
Troponin I: 0.12 ng/mL — ABNORMAL HIGH (ref <0.031)

## 2014-11-19 LAB — CBC WITH DIFFERENTIAL/PLATELET
BASOS ABS: 0.1 10*3/uL (ref 0–0.1)
BASOS PCT: 1 %
Eosinophils Absolute: 0.2 10*3/uL (ref 0–0.7)
Eosinophils Relative: 3 %
HEMATOCRIT: 34.4 % — AB (ref 35.0–47.0)
Hemoglobin: 11.1 g/dL — ABNORMAL LOW (ref 12.0–16.0)
Lymphocytes Relative: 14 %
Lymphs Abs: 1 10*3/uL (ref 1.0–3.6)
MCH: 32.6 pg (ref 26.0–34.0)
MCHC: 32.2 g/dL (ref 32.0–36.0)
MCV: 101.2 fL — ABNORMAL HIGH (ref 80.0–100.0)
MONO ABS: 0.6 10*3/uL (ref 0.2–0.9)
Monocytes Relative: 9 %
NEUTROS ABS: 5.3 10*3/uL (ref 1.4–6.5)
NEUTROS PCT: 73 %
PLATELETS: 184 10*3/uL (ref 150–440)
RBC: 3.4 MIL/uL — ABNORMAL LOW (ref 3.80–5.20)
RDW: 16 % — AB (ref 11.5–14.5)
WBC: 7.2 10*3/uL (ref 3.6–11.0)

## 2014-11-19 LAB — MRSA PCR SCREENING: MRSA by PCR: NEGATIVE

## 2014-11-19 MED ORDER — SODIUM CHLORIDE 0.9 % IV SOLN: 250.0000 mL | INTRAVENOUS | Status: DC | PRN

## 2014-11-19 MED ORDER — SODIUM CHLORIDE 0.9 % IJ SOLN
3.0000 mL | Freq: Two times a day (BID) | INTRAMUSCULAR | Status: DC
  Administered 2014-11-19 – 2014-11-20 (×3): 3 mL via INTRAVENOUS

## 2014-11-19 MED ORDER — TIMOLOL MALEATE 0.5 % OP SOLN
1.0000 [drp] | Freq: Every day | OPHTHALMIC | Status: DC
  Administered 2014-11-20: 1 [drp] via OPHTHALMIC
  Filled 2014-11-19: qty 5

## 2014-11-19 MED ORDER — ACETAMINOPHEN 325 MG PO TABS: 650.0000 mg | ORAL_TABLET | Freq: Four times a day (QID) | ORAL | Status: DC | PRN

## 2014-11-19 MED ORDER — CINACALCET HCL 30 MG PO TABS
30.0000 mg | ORAL_TABLET | Freq: Every day | ORAL | Status: DC
  Administered 2014-11-19: 30 mg via ORAL
  Filled 2014-11-19: qty 1

## 2014-11-19 MED ORDER — ONDANSETRON HCL 4 MG/2ML IJ SOLN
4.0000 mg | Freq: Four times a day (QID) | INTRAMUSCULAR | Status: DC | PRN
  Administered 2014-11-19: 4 mg via INTRAVENOUS
  Filled 2014-11-19: qty 2

## 2014-11-19 MED ORDER — ACETAMINOPHEN 650 MG RE SUPP: 650.0000 mg | Freq: Four times a day (QID) | RECTAL | Status: DC | PRN

## 2014-11-19 MED ORDER — ONDANSETRON HCL 4 MG PO TABS
4.0000 mg | ORAL_TABLET | Freq: Four times a day (QID) | ORAL | Status: DC | PRN
Start: 2014-11-19 — End: 2014-11-20

## 2014-11-19 MED ORDER — ASPIRIN 81 MG PO CHEW
324.0000 mg | CHEWABLE_TABLET | Freq: Once | ORAL | Status: AC
  Administered 2014-11-19: 243 mg via ORAL
  Filled 2014-11-19: qty 4

## 2014-11-19 MED ORDER — HEPARIN SODIUM (PORCINE) 5000 UNIT/ML IJ SOLN
5000.0000 [IU] | Freq: Two times a day (BID) | INTRAMUSCULAR | Status: DC
Start: 2014-11-19 — End: 2014-11-20
  Administered 2014-11-19 – 2014-11-20 (×2): 5000 [IU] via SUBCUTANEOUS
  Filled 2014-11-19 (×2): qty 1

## 2014-11-19 MED ORDER — ALBUTEROL SULFATE (2.5 MG/3ML) 0.083% IN NEBU: 2.5000 mg | INHALATION_SOLUTION | RESPIRATORY_TRACT | Status: DC | PRN

## 2014-11-19 MED ORDER — ASPIRIN 81 MG PO CHEW: 243.0000 mg | CHEWABLE_TABLET | Freq: Once | ORAL | Status: DC

## 2014-11-19 MED ORDER — LATANOPROST 0.005 % OP SOLN
1.0000 [drp] | Freq: Every day | OPHTHALMIC | Status: DC
  Administered 2014-11-19 – 2014-11-20 (×2): 1 [drp] via OPHTHALMIC
  Filled 2014-11-19: qty 2.5

## 2014-11-19 MED ORDER — SODIUM CHLORIDE 0.9 % IJ SOLN: 3.0000 mL | INTRAMUSCULAR | Status: DC | PRN

## 2014-11-19 NOTE — H&P (Addendum)
[facility] - [place] [facility]   PATIENT NAME: Sarah Macias    MR#:  [id number]  DATE OF BIRTH:  October 18, 1923  DATE OF ADMISSION:  11/19/2014  PRIMARY CARE PHYSICIAN: Dorothey Baseman, MD   REQUESTING/REFERRING PHYSICIAN: [name], MD  CHIEF COMPLAINT:   Chief Complaint  Patient presents with  . Dizziness   Dizziness, nausea and vomiting today. HISTORY OF PRESENT ILLNESS:  Sarah Macias  is a 81 y.o. female with a known history of hypertension, CHF, aortic aneurysm, ESRD and A. fib status post pacemaker. The patient was sent to the ED due to dizziness, nausea and vomiting this morning. The patient is alert and awake. She only complains of dizziness and generalized weakness. But according to her daughter-in-law, the patient also had a nausea and vomited in the morning. The patient denies any chest pain, palpitation, orthopnea or nocturnal dyspnea, no leg edema. The patient was noticed to have a bradycardia with heart rate at surgeries in the ED. EKG showed paced bradycardia at 55 BPM. In addition, she had elevated troponin at 0.1. ED physician discussed with the Dr. Lady Gary, who suggested admitting the patient for further workup and evaluation. The patient could not get hemodialysis today due to above mentioned problems.  Past Medical History  Diagnosis Date  . Dialysis patient   . A-fib   . Hypertension   . Renal disorder   . CHF (congestive heart failure)   . Pulmonary edema 2012  . Enlarged heart   . Osteoporosis   . Compression fracture   . Glaucoma   . Macular degeneration   . Aortic aneurysm     PAST SURGICAL HISTORY:   Past Surgical History  Procedure Laterality Date  . Pacemaker placement      1 lead  . Eye surgery      SOCIAL HISTORY:   Social History  Substance Use Topics  . Smoking status: Never Smoker   . Smokeless tobacco: Not on file  . Alcohol Use: No    FAMILY HISTORY:  No family history on file.  DRUG  ALLERGIES:   Allergies  Allergen Reactions  . Rofecoxib     Other reaction(s): Dizziness    REVIEW OF SYSTEMS:  CONSTITUTIONAL: No fever, but has dizziness and generalized weakness.  EYES: No blurred or double vision.  EARS, NOSE, AND THROAT: No tinnitus or ear pain.  RESPIRATORY: No cough, shortness of breath, wheezing or hemoptysis.  CARDIOVASCULAR: No chest pain, orthopnea, edema.  GASTROINTESTINAL: No nausea, vomiting, diarrhea or abdominal pain.  GENITOURINARY: No urine.  ENDOCRINE: No polyuria, nocturia,  HEMATOLOGY: No anemia, easy bruising or bleeding SKIN: No rash or lesion. MUSCULOSKELETAL: No joint pain or arthritis.   NEUROLOGIC: No tingling, numbness, weakness.  PSYCHIATRY: No anxiety or depression.   MEDICATIONS AT HOME:   Prior to Admission medications   Medication Sig Start Date End Date Taking? Authorizing Provider  aspirin EC 81 MG tablet Take 81 mg by mouth daily.    Historical Provider, MD  B Complex-C-Folic Acid (RENO CAPS PO) Take 1 capsule by mouth daily with supper.    Historical Provider, MD  carvedilol (COREG) 3.125 MG tablet Take 1 tablet by mouth 2 (two) times daily. 05/09/14   Historical Provider, MD  latanoprost (XALATAN) 0.005 % ophthalmic solution Apply 1 drop to eye daily. In both eyes 05/30/14   Historical Provider, MD  Multiple Vitamins-Minerals (I-VITE PO) Take 1 tablet by mouth daily with supper.    Historical Provider, MD  omega-3 acid ethyl esters (LOVAZA) 1 G capsule Take 2 g by mouth 2 (two) times daily with a meal.    Historical Provider, MD  timolol (TIMOPTIC) 0.5 % ophthalmic solution Place 1 drop into both eyes daily.    Historical Provider, MD      VITAL SIGNS:  Blood pressure 118/50, pulse 55, temperature 97.7 F (36.5 C), temperature source Oral, resp. rate 17, height 5' (1.524 m), weight 52.617 kg (116 lb), SpO2 96 %.  PHYSICAL EXAMINATION:  GENERAL:  81 y.o.-year-old patient lying in the bed with no acute distress.  EYES:  Pupils equal, round, reactive to light and accommodation. No scleral icterus. Extraocular muscles intact.  HEENT: Head atraumatic, normocephalic. Oropharynx and nasopharynx clear.  NECK:  Supple, no jugular venous distention. No thyroid enlargement, no tenderness.  LUNGS: Normal breath sounds bilaterally, no wheezing, mild rales on the left side. No use of accessory muscles of respiration.  CARDIOVASCULAR: S1, S2 normal. Systolic murmurs at 3/6, no rubs, or gallops.  ABDOMEN: Soft, nontender, nondistended. Bowel sounds present. No organomegaly or mass.  EXTREMITIES: No pedal edema, cyanosis, or clubbing.  NEUROLOGIC: Cranial nerves II through XII are intact. Muscle strength 5/5 in all extremities. Sensation intact. Gait not checked.  PSYCHIATRIC: The patient is alert and oriented x 3.  SKIN: No obvious rash, lesion, or ulcer.   LABORATORY PANEL:   CBC  Recent Labs Lab 11/19/14 1306  WBC 7.2  HGB 11.1*  HCT 34.4*  PLT 184   ------------------------------------------------------------------------------------------------------------------  Chemistries   Recent Labs Lab 11/19/14 1306  NA 139  K 4.3  CL 93*  CO2 30  GLUCOSE 112*  BUN 59*  CREATININE 6.27*  CALCIUM 8.9  AST 27  ALT 19  ALKPHOS 72  BILITOT 1.2   ------------------------------------------------------------------------------------------------------------------  Cardiac Enzymes  Recent Labs Lab 11/19/14 1306  TROPONINI 0.10*   ------------------------------------------------------------------------------------------------------------------  RADIOLOGY:  Dg Chest Portable 1 View  11/19/2014   CLINICAL DATA:  Shortness of breath  EXAM: PORTABLE CHEST - 1 VIEW  COMPARISON:  08/25/2013  FINDINGS: Moderate cardiomegaly noted with central vascular congestion. Single lead pacer in place. No focal pulmonary opacity. Minimal prominence of interstitial Kerley B-lines noted at the lung bases bilaterally. No pleural  effusion.  IMPRESSION: Cardiomegaly with minimal interstitial prominence which could suggest early edema but is nonspecific.   Electronically Signed   By: Christiana Pellant M.D.   On: 11/19/2014 13:00    EKG:   Orders placed or performed during the hospital encounter of 11/19/14  . EKG 12-Lead  . EKG 12-Lead    IMPRESSION AND PLAN:   Symptomatic bradycardia Elevated troponin Mild pulmonary edema Elevated troponin ESRD Hypertension  Plan of treatment:  The patient will be placed for observation, continue telemetry monitor, hold her Coreg, follow-up troponin level,  [employer] representative and cardiology consult from Dr. Lady Gary.  The patient was treated with aspirin in ED and I will continue aspirin. For mild pulmonary edema and the ESRD, the patient hadn't get hemodialysis today as scheduled. I will request nephrology consult to arrange hemodialysis today.    All the records are reviewed and case discussed with ED provider. Management plans discussed with the patient, her daughter-in-law (POA) and they are in agreement.  CODE STATUS: DNR  TOTAL TIME TAKING CARE OF THIS PATIENT: 53 minutes.    Shaune Pollack M.D on 11/19/2014 at 2:56 PM  Between 7am to 6pm - Pager - [phone]  After 6pm go to [email or web address] - password [email or web address] [facility]  [facility]  Office  [phone]  CC: Primary care physician; Dorothey Baseman, MD

## 2014-11-19 NOTE — ED Notes (Signed)
Reports from family that pt was vomiting, dizzy and clammy this morning with higher than normal BP

## 2014-11-19 NOTE — ED Notes (Signed)
Patient reports feeling nauseated and dizzy. Reports vomiting this morning.  Denies abdominal pain. Was supposed to go to dialysis this morning at 1050 but did not go.  Normal schedule is TTS. Patient also has a pacemaker as well.

## 2014-11-19 NOTE — Progress Notes (Signed)
[facility]  ROUNDING NOTE   Subjective:   Patient was on her way to hemodialysis this morning when she started to get lightheaded and dizzy. Patient brought to ED by her family. Found to have symptomatic bradycardia. Serum potassium of 4.3.  She was then admitted for work up of her bradycardia. She has missed dialysis today.   Objective:  Vital signs in last 24 hours:  Temp:  [97.7 F (36.5 C)] 97.7 F (36.5 C) (09/10 1219) Pulse Rate:  [45-60] 54 (09/10 1543) Resp:  [16-26] 24 (09/10 1543) BP: (116-131)/(43-75) 118/75 mmHg (09/10 1543) SpO2:  [95 %-98 %] 97 % (09/10 1543) Weight:  [52.617 kg (116 lb)-62.46 kg (137 lb 11.2 oz)] 62.46 kg (137 lb 11.2 oz) (09/10 1543)  Weight change:  Filed Weights   11/19/14 1219 11/19/14 1543  Weight: 52.617 kg (116 lb) 62.46 kg (137 lb 11.2 oz)    Intake/Output:     Intake/Output this shift:     Physical Exam: General: NAD, laying in bed  Head: Normocephalic, atraumatic. Moist oral mucosal membranes  Eyes: Anicteric, PERRL  Neck: Supple, trachea midline  Lungs:  Bilateral crackles  Heart: Bradycardia, +murmur  Abdomen:  Soft, nontender  Extremities: no peripheral edema.  Neurologic: Nonfocal, moving all four extremities  Skin: No lesions  Access: Left arm AVF    Basic Metabolic Panel:  Recent Labs Lab 11/19/14 1306  NA 139  K 4.3  CL 93*  CO2 30  GLUCOSE 112*  BUN 59*  CREATININE 6.27*  CALCIUM 8.9    Liver Function Tests:  Recent Labs Lab 11/19/14 1306  AST 27  ALT 19  ALKPHOS 72  BILITOT 1.2  PROT 7.1  ALBUMIN 3.2*   No results for input(s): LIPASE, AMYLASE in the last 168 hours. No results for input(s): AMMONIA in the last 168 hours.  CBC:  Recent Labs Lab 11/19/14 1306  WBC 7.2  NEUTROABS 5.3  HGB 11.1*  HCT 34.4*  MCV 101.2*  PLT 184    Cardiac Enzymes:  Recent Labs Lab 11/19/14 1306  TROPONINI 0.10*    BNP: Invalid input(s): POCBNP  CBG: No results for input(s):  GLUCAP in the last 168 hours.  Microbiology: Results for orders placed or performed in visit on 07/18/13  Culture, blood (single)     Status: None   Collection Time: 07/18/13 11:30 AM  Result Value Ref Range Status   Micro Text Report   Final       COMMENT                   NO GROWTH AEROBICALLY/ANAEROBICALLY IN 5 DAYS   ANTIBIOTIC                                                      Culture, blood (single)     Status: None   Collection Time: 07/18/13 11:30 AM  Result Value Ref Range Status   Micro Text Report   Final       COMMENT                   NO GROWTH AEROBICALLY/ANAEROBICALLY IN 5 DAYS   ANTIBIOTIC  Coagulation Studies: No results for input(s): LABPROT, INR in the last 72 hours.  Urinalysis: No results for input(s): COLORURINE, LABSPEC, PHURINE, GLUCOSEU, HGBUR, BILIRUBINUR, KETONESUR, PROTEINUR, UROBILINOGEN, NITRITE, LEUKOCYTESUR in the last 72 hours.  Invalid input(s): APPERANCEUR    Imaging: Dg Chest Portable 1 View  11/19/2014   CLINICAL DATA:  Shortness of breath  EXAM: PORTABLE CHEST - 1 VIEW  COMPARISON:  08/25/2013  FINDINGS: Moderate cardiomegaly noted with central vascular congestion. Single lead pacer in place. No focal pulmonary opacity. Minimal prominence of interstitial Kerley B-lines noted at the lung bases bilaterally. No pleural effusion.  IMPRESSION: Cardiomegaly with minimal interstitial prominence which could suggest early edema but is nonspecific.   Electronically Signed   By: Christiana Pellant M.D.   On: 11/19/2014 13:00     Medications:     . aspirin  243 mg Oral Once  . cinacalcet  30 mg Oral Q supper  . heparin  5,000 Units Subcutaneous Q12H  . latanoprost  1 drop Both Eyes Daily  . sodium chloride  3 mL Intravenous Q12H  . timolol  1 drop Both Eyes Daily   sodium chloride, acetaminophen **OR** acetaminophen, albuterol, ondansetron **OR** ondansetron (ZOFRAN) IV, sodium  chloride  Assessment/ Plan:  Ms. Sarah Macias is a 81 y.o. white female with hypertension, gallstones, atrial fibrillation, congestive heart failure, secondary hyperparathyroidism, glaucoma, macular degeneration, aortic aneurysm, admitted to [facility] on 11/19/2014 for Symptomatic bradycardia [R00.1]  1. End Stage Renal Disease: TTS [address] [facility]. Missed dialysis today. No acute indication for dialysis at this time. Will tentatively plan for dialysis tomorrow morning. Then resume TTS schedule. Has history of hyperkalemia. Potassium at goal today.  - monitor volume and respiratory status.   2. Hypertension: holding carvedilol due to symptomatic bradycardia. Also on opthalmic timolol, unclear if this is contributing to heart rate.  - Appreciate cardiology input.   3. Secondary Hyperparathyroidism with hypercalcemia: PTH 583 on 10/06/14. Calcium at goal.  - Continue cinacalcet.  - not currently on binders.   4. Anemia of chronic kidney disease: receives procrit as outpatient. Hemoglobin 11.1. No indication for ESA at this time.    LOS:  Lamont Dowdy 9/10/20165:07 PM

## 2014-11-19 NOTE — ED Provider Notes (Signed)
[facility] Emergency Department Provider Note  Time seen: 1:17 PM  I have reviewed the triage vital signs and the nursing notes.   HISTORY  Chief Complaint Dizziness    HPI Sarah Macias is a 81 y.o. female with a past medical history is of atrial fibrillation, hypertension, CK D, CHF, pulmonary edema, aortic aneurysm, dialysis, presents to the emergency department with nausea and dizziness. According to the patient beginning around 10:15 this morning she was having episodes where she would become very nauseated, dizzy and lightheaded. States he's episode would last seconds to minutes and then resolved. Patient has a pacemaker placed 2 years ago. Patient sees Dr. Darrold Junker.   Patient denies any chest pain, abdominal pain. The patient did become nauseated and vomited twice earlier this morning. Patient denies any fever. During triage the patient's heart rate was noted to drop into the 30s, and the patient became symptomatic. Upon my exam the patient denies any symptoms.    Past Medical History  Diagnosis Date  . Dialysis patient   . A-fib   . Hypertension   . Renal disorder   . CHF (congestive heart failure)   . Pulmonary edema 2012  . Enlarged heart   . Osteoporosis   . Compression fracture   . Glaucoma   . Macular degeneration   . Aortic aneurysm     There are no active problems to display for this patient.   Past Surgical History  Procedure Laterality Date  . Pacemaker placement      1 lead  . Eye surgery      Current Outpatient Rx  Name  Route  Sig  Dispense  Refill  . aspirin EC 81 MG tablet   Oral   Take 81 mg by mouth daily.         . B Complex-C-Folic Acid (RENO CAPS PO)   Oral   Take 1 capsule by mouth daily with supper.         . carvedilol (COREG) 3.125 MG tablet   Oral   Take 1 tablet by mouth 2 (two) times daily.      7   . latanoprost (XALATAN) 0.005 % ophthalmic solution   Ophthalmic   Apply 1 drop to eye daily.  In both eyes      4   . Multiple Vitamins-Minerals (I-VITE PO)   Oral   Take 1 tablet by mouth daily with supper.         Marland Kitchen omega-3 acid ethyl esters (LOVAZA) 1 G capsule   Oral   Take 2 g by mouth 2 (two) times daily with a meal.         . timolol (TIMOPTIC) 0.5 % ophthalmic solution   Both Eyes   Place 1 drop into both eyes daily.           Allergies Review of patient's allergies indicates no known allergies.  No family history on file.  Social History Social History  Substance Use Topics  . Smoking status: Never Smoker   . Smokeless tobacco: None  . Alcohol Use: No    Review of Systems Constitutional: Negative for fever. Cardiovascular: Negative for chest pain. Respiratory: Negative for shortness of breath. Gastrointestinal: Negative for abdominal pain Genitourinary: Negative for dysuria. Neurological: Negative for headaches, focal weakness or numbness. 10-point ROS otherwise negative.  ____________________________________________   PHYSICAL EXAM:  VITAL SIGNS: ED Triage Vitals  Enc Vitals Group     BP 11/19/14 1219 131/53 mmHg  Pulse Rate 11/19/14 1219 60     Resp 11/19/14 1237 26     Temp 11/19/14 1219 97.7 F (36.5 C)     Temp Source 11/19/14 1219 Oral     SpO2 11/19/14 1219 98 %     Weight 11/19/14 1219 116 lb (52.617 kg)     Height 11/19/14 1219 5' (1.524 m)     Head Cir --      Peak Flow --      Pain Score --      Pain Loc --      Pain Edu? --      Excl. in GC? --     Constitutional: Alert and oriented. Well appearing and in no distress. Eyes: Normal exam ENT   Mouth/Throat: Mucous membranes are moist. Cardiovascular: Normal rate, regular rhythm. No murmur Respiratory: Normal respiratory effort without tachypnea nor retractions. Breath sounds are clear and equal bilaterally. No wheezes/rales/rhonchi. Gastrointestinal: Soft and nontender. No distention. Musculoskeletal: Nontender with normal range of motion in all  extremities Neurologic:  Normal speech and language. No gross focal neurologic deficits Skin:  Skin is warm, dry and intact.  Psychiatric: Mood and affect are normal. Speech and behavior are normal.  ____________________________________________    EKG  EKG reviewed and interpreted by myself shows sinus bradycardia 88 bpm, widened QRS, left axis deviation, nonspecific ST changes present. Most consistent with left bundle branch block.  Repeat EKG 12:25:14 shows a ventricular paced rhythm at 63 bpm.  ____________________________________________    RADIOLOGY  Cardiomegaly with interstitial prominence, possible edema but nonspecific.  ____________________________________________   INITIAL IMPRESSION / ASSESSMENT AND PLAN / ED COURSE  Pertinent labs & imaging results that were available during my care of the patient were reviewed by me and considered in my medical decision making (see chart for details).  Patient with possible symptomatic bradycardia. The patient has a pacemaker in place. During triage the patient became symptomatic and her heart rate dropped into the 30s. EKG prior to episode appears to be an intrinsic rhythm. After symptom onset repeat EKG performed several minutes later shows a ventricular paced rhythm. We will check labs including troponin, and monitor closely in the emergency department on telemetry. I have contacted the pacemaker representative who will be in to interrogate the pacemaker.  I discussed patient with Dr. Lady Gary, as well as a [employer] representative. They do not believe interrogating the pacemaker be of much utility in this instance. Patient's troponin has return of 0.1. Patient does have a history of a slight troponin elevation 0.04 in the past. Given the patient's concerning findings this morning along with an elevated troponin we'll admit the patient for further workup and evaluation. Discussed with the patient is  agreeable.    ____________________________________________   FINAL CLINICAL IMPRESSION(S) / ED DIAGNOSES  Bradycardia Nausea Dizziness   [name], MD 11/19/14 1407

## 2014-11-19 NOTE — Progress Notes (Signed)
A & O. HOH. Daughter at the bedside. L arm fistula. Pt reports no pain. No nausea. Takes meds ok. Pt has no further concerns at this time. Skin checked by Irwin Brakeman RN

## 2014-11-20 LAB — BASIC METABOLIC PANEL
ANION GAP: 15 (ref 5–15)
BUN: 70 mg/dL — ABNORMAL HIGH (ref 6–20)
CALCIUM: 8.6 mg/dL — AB (ref 8.9–10.3)
CHLORIDE: 96 mmol/L — AB (ref 101–111)
CO2: 28 mmol/L (ref 22–32)
Creatinine, Ser: 7.07 mg/dL — ABNORMAL HIGH (ref 0.44–1.00)
GFR calc Af Amer: 5 mL/min — ABNORMAL LOW (ref >60)
GFR calc non Af Amer: 5 mL/min — ABNORMAL LOW (ref >60)
GLUCOSE: 92 mg/dL (ref 65–99)
POTASSIUM: 4.8 mmol/L (ref 3.5–5.1)
Sodium: 139 mmol/L (ref 135–145)

## 2014-11-20 LAB — CBC
HEMATOCRIT: 31.2 % — AB (ref 35.0–47.0)
Hemoglobin: 10.2 g/dL — ABNORMAL LOW (ref 12.0–16.0)
MCH: 32.8 pg (ref 26.0–34.0)
MCHC: 32.6 g/dL (ref 32.0–36.0)
MCV: 100.7 fL — AB (ref 80.0–100.0)
Platelets: 163 10*3/uL (ref 150–440)
RBC: 3.1 MIL/uL — AB (ref 3.80–5.20)
RDW: 16 % — ABNORMAL HIGH (ref 11.5–14.5)
WBC: 5.9 10*3/uL (ref 3.6–11.0)

## 2014-11-20 LAB — TROPONIN I
TROPONIN I: 0.09 ng/mL — AB (ref <0.031)
TROPONIN I: 0.1 ng/mL — AB (ref <0.031)

## 2014-11-20 MED ORDER — INFLUENZA VAC SPLIT QUAD 0.5 ML IM SUSY: 0.5000 mL | PREFILLED_SYRINGE | INTRAMUSCULAR | Status: DC

## 2014-11-20 NOTE — Discharge Summary (Signed)
[facility] - [place] at [facility]   PATIENT NAME: Sarah Macias    MR#:  [id number]  DATE OF BIRTH:  1923-07-02  DATE OF ADMISSION:  11/19/2014 ADMITTING PHYSICIAN: Shaune Pollack, MD  DATE OF DISCHARGE: 11/20/2014  PRIMARY CARE PHYSICIAN: Dorothey Baseman, MD    ADMISSION DIAGNOSIS:  Symptomatic bradycardia [R00.1]  DISCHARGE DIAGNOSIS:  Principal Problem:   Symptomatic bradycardia Active Problems:   Elevated troponin   Acute pulmonary edema   SECONDARY DIAGNOSIS:   Past Medical History  Diagnosis Date  . Dialysis patient   . A-fib   . Hypertension   . Renal disorder   . CHF (congestive heart failure)   . Pulmonary edema 2012  . Enlarged heart   . Osteoporosis   . Compression fracture   . Glaucoma   . Macular degeneration   . Aortic aneurysm     HOSPITAL COURSE:   81 year old female with past medical history of end-stage renal disease on hemodialysis, hypertension, chronic afibrillation, glaucoma, status post pacemaker who presented to the hospital with dizziness and noted to be bradycardic.  #1 bradycardia-this is sinus bradycardia. Patient is clinically symptomatic with this and likely this was secondary to a vagal event. Patient was observed overnight on telemetry and had no further evidence of worsening bradycardia. Patient was seen by cardiology by Dr. Lady Gary who interrogated the patient's pacemaker and did not find any evidence of worsening bradycardia or arrhythmia. -Patient's Coreg was held while in the hospital although as per cardiology it can be resumed upon discharge.  #2 end-stage renal disease on hemodialysis-patient was seen by nephrology and as she missed her dialysis on Saturday she was dialyzed on Sunday prior to discharge.  #3 glaucoma-patient will continue atenolol and latanoprost eyedrops.  #4 secondary hyperparathyroidism-patient will continue Sensipar.  DISCHARGE CONDITIONS:   Stable  CONSULTS OBTAINED:  Treatment  Team:  Dalia Heading, MD  DRUG ALLERGIES:   Allergies  Allergen Reactions  . Rofecoxib     Other reaction(s): Dizziness    DISCHARGE MEDICATIONS:   Current Discharge Medication List    CONTINUE these medications which have NOT CHANGED   Details  B Complex-C-Folic Acid (RENO CAPS PO) Take 1 capsule by mouth daily with supper.    carvedilol (COREG) 3.125 MG tablet Take 1 tablet by mouth See admin instructions. Take 1 tablet by mouth twice daily on Monday, Wednesday, Friday, and Sunday. Take 1 tablet by mouth every evening on dialysis days on Tuesday, Thursday, and Saturday. Refills: 7    cinacalcet (SENSIPAR) 30 MG tablet Take 30 mg by mouth daily with supper.    denosumab (PROLIA) 60 MG/ML SOLN injection Inject 60 mg into the skin every 6 (six) months. Administer in upper arm, thigh, or abdomen    ibuprofen (ADVIL,MOTRIN) 200 MG tablet Take 200 mg by mouth 2 (two) times daily. Take at breakfast and at lunch.    latanoprost (XALATAN) 0.005 % ophthalmic solution Place 1 drop into both eyes daily.  Refills: 4    Multiple Vitamins-Minerals (I-VITE PO) Take 1 tablet by mouth daily with supper.    omega-3 acid ethyl esters (LOVAZA) 1 G capsule Take 1 g by mouth 2 (two) times daily with a meal.     timolol (TIMOPTIC) 0.5 % ophthalmic solution Place 1 drop into both eyes daily.         DISCHARGE INSTRUCTIONS:   DIET:  Cardiac diet and Renal diet  DISCHARGE CONDITION:  Stable  ACTIVITY:  Activity as tolerated  OXYGEN:  Home Oxygen: No.   Oxygen Delivery: room air  DISCHARGE LOCATION:  home   If you experience worsening of your admission symptoms, develop shortness of breath, life threatening emergency, suicidal or homicidal thoughts you must seek medical attention immediately by calling 911 or calling your MD immediately  if symptoms less severe.  You Must read complete instructions/literature along with all the possible adverse reactions/side effects for all the  Medicines you take and that have been prescribed to you. Take any new Medicines after you have completely understood and accpet all the possible adverse reactions/side effects.   Please note  You were cared for by a hospitalist during your hospital stay. If you have any questions about your discharge medications or the care you received while you were in the hospital after you are discharged, you can call the unit and asked to speak with the hospitalist on call if the hospitalist that took care of you is not available. Once you are discharged, your primary care physician will handle any further medical issues. Please note that NO REFILLS for any discharge medications will be authorized once you are discharged, as it is imperative that you return to your primary care physician (or establish a relationship with a primary care physician if you do not have one) for your aftercare needs so that they can reassess your need for medications and monitor your lab values.     Today   Patient denies any chest pain, shortness of breath, dizziness. Daughter at bedside.  VITAL SIGNS:  Blood pressure 119/44, pulse 55, temperature 98 F (36.7 C), temperature source Oral, resp. rate 20, height 5' (1.524 m), weight 62.46 kg (137 lb 11.2 oz), SpO2 98 %.  I/O:   Intake/Output Summary (Last 24 hours) at 11/20/14 1341 Last data filed at 11/20/14 1330  Gross per 24 hour  Intake      3 ml  Output   1000 ml  Net   -997 ml    PHYSICAL EXAMINATION:  GENERAL:  81 y.o.-year-old patient lying in the bed with no acute distress.  EYES: Pupils equal, round, reactive to light and accommodation. No scleral icterus. Extraocular muscles intact.  HEENT: Head atraumatic, normocephalic. Oropharynx and nasopharynx clear.  NECK:  Supple, no jugular venous distention. No thyroid enlargement, no tenderness.  LUNGS: Normal breath sounds bilaterally, no wheezing, rales,rhonchi. No use of accessory muscles of respiration.   CARDIOVASCULAR: S1, S2 normal. No murmurs, rubs, or gallops.  ABDOMEN: Soft, non-tender, non-distended. Bowel sounds present. No organomegaly or mass.  EXTREMITIES: No pedal edema, cyanosis, or clubbing.  NEUROLOGIC: Cranial nerves II through XII are intact. No focal motor or sensory defecits b/l.  PSYCHIATRIC: The patient is alert and oriented x 3. Good affect.  SKIN: No obvious rash, lesion, or ulcer.   DATA REVIEW:   CBC  Recent Labs Lab 11/20/14 1103  WBC 5.9  HGB 10.2*  HCT 31.2*  PLT 163    Chemistries   Recent Labs Lab 11/19/14 1306 11/20/14 0315  NA 139 139  K 4.3 4.8  CL 93* 96*  CO2 30 28  GLUCOSE 112* 92  BUN 59* 70*  CREATININE 6.27* 7.07*  CALCIUM 8.9 8.6*  AST 27  --   ALT 19  --   ALKPHOS 72  --   BILITOT 1.2  --     Cardiac Enzymes  Recent Labs Lab 11/20/14 0315  TROPONINI 0.10*    RADIOLOGY:  Dg Chest Portable 1 View  11/19/2014  CLINICAL DATA:  Shortness of breath  EXAM: PORTABLE CHEST - 1 VIEW  COMPARISON:  08/25/2013  FINDINGS: Moderate cardiomegaly noted with central vascular congestion. Single lead pacer in place. No focal pulmonary opacity. Minimal prominence of interstitial Kerley B-lines noted at the lung bases bilaterally. No pleural effusion.  IMPRESSION: Cardiomegaly with minimal interstitial prominence which could suggest early edema but is nonspecific.   Electronically Signed   By: Christiana Pellant M.D.   On: 11/19/2014 13:00      Management plans discussed with the patient, family and they are in agreement.  CODE STATUS:     Code Status Orders        Start     Ordered   11/19/14 1537  Do not attempt resuscitation (DNR)   Continuous    Question Answer Comment  In the event of cardiac or respiratory ARREST Do not call a "code blue"   In the event of cardiac or respiratory ARREST Do not perform Intubation, CPR, defibrillation or ACLS   In the event of cardiac or respiratory ARREST Use medication by any route, position,  wound care, and other measures to relive pain and suffering. May use oxygen, suction and manual treatment of airway obstruction as needed for comfort.      11/19/14 1536    Advance Directive Documentation        Most Recent Value   Type of Advance Directive  Healthcare Power of Attorney   Pre-existing out of facility DNR order (yellow form or pink MOST form)     "MOST" Form in Place?        TOTAL TIME TAKING CARE OF THIS PATIENT: 40 minutes.    Houston Siren M.D on 11/20/2014 at 1:41 PM  Between 7am to 6pm - Pager - [phone]  After 6pm go to [email or web address] - password [email or web address] [facility]  [facility]  Office  [phone]  CC: Primary care physician; Dorothey Baseman, MD

## 2014-11-20 NOTE — Progress Notes (Signed)
[facility]  ROUNDING NOTE   Subjective:   Seen and examined on hemodialysis.   Objective:  Vital signs in last 24 hours:  Temp:  [97.6 F (36.4 C)-98 F (36.7 C)] 98 F (36.7 C) (09/11 1055) Pulse Rate:  [45-60] 45 (09/11 1115) Resp:  [16-26] 20 (09/11 1115) BP: (92-131)/(35-75) 92/42 mmHg (09/11 1115) SpO2:  [93 %-99 %] 98 % (09/11 1115) Weight:  [52.617 kg (116 lb)-62.46 kg (137 lb 11.2 oz)] 62.46 kg (137 lb 11.2 oz) (09/10 1543)  Weight change:  Filed Weights   11/19/14 1219 11/19/14 1543  Weight: 52.617 kg (116 lb) 62.46 kg (137 lb 11.2 oz)    Intake/Output:     Intake/Output this shift:  Total I/O In: 3 [I.V.:3] Out: 0   Physical Exam: General: NAD, laying in bed  Head: Normocephalic, atraumatic. Moist oral mucosal membranes  Eyes: Anicteric, PERRL  Neck: Supple, trachea midline  Lungs:  Bilateral crackles  Heart: Bradycardia, +murmur  Abdomen:  Soft, nontender  Extremities: no peripheral edema.  Neurologic: Nonfocal, moving all four extremities  Skin: No lesions  Access: Left arm AVF    Basic Metabolic Panel:  Recent Labs Lab 11/19/14 1306 11/20/14 0315  NA 139 139  K 4.3 4.8  CL 93* 96*  CO2 30 28  GLUCOSE 112* 92  BUN 59* 70*  CREATININE 6.27* 7.07*  CALCIUM 8.9 8.6*    Liver Function Tests:  Recent Labs Lab 11/19/14 1306  AST 27  ALT 19  ALKPHOS 72  BILITOT 1.2  PROT 7.1  ALBUMIN 3.2*   No results for input(s): LIPASE, AMYLASE in the last 168 hours. No results for input(s): AMMONIA in the last 168 hours.  CBC:  Recent Labs Lab 11/19/14 1306 11/20/14 1103  WBC 7.2 5.9  NEUTROABS 5.3  --   HGB 11.1* 10.2*  HCT 34.4* 31.2*  MCV 101.2* 100.7*  PLT 184 163    Cardiac Enzymes:  Recent Labs Lab 11/19/14 1306 11/19/14 1737 11/20/14 0027 11/20/14 0315  TROPONINI 0.10* 0.12* 0.09* 0.10*    BNP: Invalid input(s): POCBNP  CBG: No results for input(s): GLUCAP in the last 168  hours.  Microbiology: Results for orders placed or performed during the hospital encounter of 11/19/14  MRSA PCR Screening     Status: None   Collection Time: 11/19/14  5:30 PM  Result Value Ref Range Status   MRSA by PCR NEGATIVE NEGATIVE Final    Comment:        The GeneXpert MRSA Assay (FDA approved for NASAL specimens only), is one component of a comprehensive MRSA colonization surveillance program. It is not intended to diagnose MRSA infection nor to guide or monitor treatment for MRSA infections.     Coagulation Studies: No results for input(s): LABPROT, INR in the last 72 hours.  Urinalysis: No results for input(s): COLORURINE, LABSPEC, PHURINE, GLUCOSEU, HGBUR, BILIRUBINUR, KETONESUR, PROTEINUR, UROBILINOGEN, NITRITE, LEUKOCYTESUR in the last 72 hours.  Invalid input(s): APPERANCEUR    Imaging: Dg Chest Portable 1 View  11/19/2014   CLINICAL DATA:  Shortness of breath  EXAM: PORTABLE CHEST - 1 VIEW  COMPARISON:  08/25/2013  FINDINGS: Moderate cardiomegaly noted with central vascular congestion. Single lead pacer in place. No focal pulmonary opacity. Minimal prominence of interstitial Kerley B-lines noted at the lung bases bilaterally. No pleural effusion.  IMPRESSION: Cardiomegaly with minimal interstitial prominence which could suggest early edema but is nonspecific.   Electronically Signed   By: Christiana Pellant M.D.   On:  11/19/2014 13:00     Medications:     . aspirin  243 mg Oral Once  . cinacalcet  30 mg Oral Q supper  . heparin  5,000 Units Subcutaneous Q12H  . [START ON 11/21/2014] Influenza vac split quadrivalent PF  0.5 mL Intramuscular Tomorrow-1000  . latanoprost  1 drop Both Eyes Daily  . sodium chloride  3 mL Intravenous Q12H  . timolol  1 drop Both Eyes Daily   sodium chloride, acetaminophen **OR** acetaminophen, albuterol, ondansetron **OR** ondansetron (ZOFRAN) IV, sodium chloride  Assessment/ Plan:  Sarah Macias is a 81 y.o. white female  with hypertension, gallstones, atrial fibrillation, congestive heart failure, secondary hyperparathyroidism, glaucoma, macular degeneration, aortic aneurysm, admitted to [facility] on 11/19/2014 for Symptomatic bradycardia [R00.1]  1. End Stage Renal Disease: TTS [address] [facility]. Missed dialysis yesterday. Seen on dialysis today, tolerating treatment well. Then resume TTS schedule. Has history of hyperkalemia. Potassium at goal today.  - monitor volume and respiratory status.   2. Hypertension: holding carvedilol due to symptomatic bradycardia.  - Appreciate cardiology input.   3. Secondary Hyperparathyroidism with hypercalcemia as outpatient: PTH 583 on 10/06/14. Calcium at goal.  - Continue cinacalcet. This may be causing some nausea.  - not currently on binders.   4. Anemia of chronic kidney disease: receives procrit as outpatient. Hemoglobin 11.1. No indication for ESA at this time.    LOS:  Lamont Dowdy 9/11/201611:51 AM

## 2014-11-20 NOTE — Consult Note (Signed)
[facility] CARDIOLOGY [facility]  CARDIOLOGY CONSULT NOTE  Patient ID: Sarah Macias MRN: [id number] DOB/AGE: August 12, 1923 81 y.o.  Admit date: 11/19/2014 Referring Physician sainani Primary Physician   Primary Cardiologist paraschos Reason for Consultation bradycardia  HPI: Pt is an 64 you female with history of esrd on hd, history of afib with sss and vvi back up pacemaker who was admitted after presenting to er with episodic nausea and vomiting. While in the er during a vomiting episode she was felt to have heart rates in the 30's based on pulse ox rate. Pt was not on the monitor at the time. She was admitted for symptomatic bradycardia per er. She has minimal troponin elevation of 0.1 with intermittent ventricular pacing on telemetry. Sensed beats do not show evidence of ischemia. Interogation of her pacemaker reveals normal vvi function with approximately 30% sensed beats and 70% paced beats set at lower rate of 50 and upper rate of 130. No evidence of tachycardia events. Lead impedance normal. She missed dialysis yesterday due to events.   ROS Review of Systems - General ROS: positive for  - nasuea and vomiting Respiratory ROS: no cough, shortness of breath, or wheezing Cardiovascular ROS: negative Gastrointestinal ROS: positive for - nausea/vomiting Neurological ROS: no TIA or stroke symptoms   Past Medical History  Diagnosis Date  . Dialysis patient   . A-fib   . Hypertension   . Renal disorder   . CHF (congestive heart failure)   . Pulmonary edema 2012  . Enlarged heart   . Osteoporosis   . Compression fracture   . Glaucoma   . Macular degeneration   . Aortic aneurysm     No family history on file.  Social History   Social History  . Marital Status: Widowed    Spouse Name: N/A  . Number of Children: N/A  . Years of Education: N/A   Occupational History  . Not on file.   Social History Main Topics  . Smoking status: Never Smoker   . Smokeless  tobacco: Not on file  . Alcohol Use: No  . Drug Use: No  . Sexual Activity: Not on file   Other Topics Concern  . Not on file   Social History Narrative    Past Surgical History  Procedure Laterality Date  . Pacemaker placement      1 lead  . Eye surgery       Prescriptions prior to admission  Medication Sig Dispense Refill Last Dose  . B Complex-C-Folic Acid (RENO CAPS PO) Take 1 capsule by mouth daily with supper.   11/18/2014 at Unknown time  . carvedilol (COREG) 3.125 MG tablet Take 1 tablet by mouth See admin instructions. Take 1 tablet by mouth twice daily on Monday, Wednesday, Friday, and Sunday. Take 1 tablet by mouth every evening on dialysis days on Tuesday, Thursday, and Saturday.  7 11/18/2014 at 1800  . cinacalcet (SENSIPAR) 30 MG tablet Take 30 mg by mouth daily with supper.   11/18/2014 at Unknown time  . denosumab (PROLIA) 60 MG/ML SOLN injection Inject 60 mg into the skin every 6 (six) months. Administer in upper arm, thigh, or abdomen   11/18/2014 at Unknown time  . ibuprofen (ADVIL,MOTRIN) 200 MG tablet Take 200 mg by mouth 2 (two) times daily. Take at breakfast and at lunch.   11/19/2014 at Unknown time  . latanoprost (XALATAN) 0.005 % ophthalmic solution Place 1 drop into both eyes daily.   4 11/18/2014 at  Unknown time  . Multiple Vitamins-Minerals (I-VITE PO) Take 1 tablet by mouth daily with supper.   11/18/2014 at Unknown time  . omega-3 acid ethyl esters (LOVAZA) 1 G capsule Take 1 g by mouth 2 (two) times daily with a meal.    11/19/2014 at Unknown time  . timolol (TIMOPTIC) 0.5 % ophthalmic solution Place 1 drop into both eyes daily.   11/19/2014 at Unknown time    Physical Exam: Blood pressure 114/35, pulse 58, temperature 97.6 F (36.4 C), temperature source Oral, resp. rate 18, height 5' (1.524 m), weight 62.46 kg (137 lb 11.2 oz), SpO2 93 %.   General appearance: cooperative Head: Normocephalic, without obvious abnormality, atraumatic Resp: clear to auscultation  bilaterally Cardio: irregularly irregular rhythm GI: normal findings: soft, non-tender Pulses: 2+ and symmetric Neurologic: Grossly normal Labs:   Lab Results  Component Value Date   WBC 7.2 11/19/2014   HGB 11.1* 11/19/2014   HCT 34.4* 11/19/2014   MCV 101.2* 11/19/2014   PLT 184 11/19/2014    Recent Labs Lab 11/19/14 1306 11/20/14 0315  NA 139 139  K 4.3 4.8  CL 93* 96*  CO2 30 28  BUN 59* 70*  CREATININE 6.27* 7.07*  CALCIUM 8.9 8.6*  PROT 7.1  --   BILITOT 1.2  --   ALKPHOS 72  --   ALT 19  --   AST 27  --   GLUCOSE 112* 92   Lab Results  Component Value Date   TROPONINI 0.10* 11/20/2014      Radiology: No significant edema or effusion EKG: Afib with intermittant sensed and paced ventricular beats.   ASSESSMENT AND PLAN:  81 yo female with histoyr of esrd on hd, history of afib with sss with ppm who was admitted with episodic nausea and vomiting. She was felt to have symptomatic bradycardia in the er during a nausea/vomiting event by rates on pulse oximeter. She had minimal elevated troponin level at 0.10. No evidence of ischemia by sensed beats. Interrogation of the pacemaker reveals normal sensing and pacing. Etiology of apparent bradycardia events are likley secondary to poorly conducted pulsations to the periphery on pulse ox during vagal response to nausea and vomiting. No evidence of pacemaker malfunciton. Would consider holter monitor as outpatient after discharge with early follow up with Dr. Darrold Junker.   ESRD-will need continued hd as outlined per nephrology.  Signed: Dalia Heading MD, [facility] 11/20/2014, 9:51 AM

## 2014-11-20 NOTE — Discharge Instructions (Signed)

## 2014-11-21 LAB — HEPATITIS B SURFACE ANTIBODY, QUANTITATIVE: Hepatitis B-Post: 7.9 m[IU]/mL — ABNORMAL LOW (ref >9.9)

## 2014-11-21 LAB — HEPATITIS C ANTIBODY: HCV Ab: <0.1 s/co ratio (ref 0.0–0.9)

## 2014-11-21 LAB — HEPATITIS B SURFACE ANTIGEN: Hepatitis B Surface Ag: NEGATIVE

## 2015-03-07 IMAGING — CT CT ANGIO CHEST
2 of 6 series · 18 of 36 positions shown · IV contrast (APPLIED)
Comparison: Chest x-ray dated 07/09/2013 and CT scan of the chest
dated 08/21/2012

CLINICAL DATA: Chest pain and dyspnea.  Lung a D-dimer.

EXAM:
CT ANGIOGRAPHY CHEST WITH CONTRAST
TECHNIQUE: Multidetector CT imaging of the chest was performed using the
standard protocol during bolus administration of intravenous
contrast. Multiplanar CT image reconstructions and MIPs were
obtained to evaluate the vascular anatomy.
CONTRAST:  75 cc Isovue 370

[Series 5: pe 1.0 thins · axial · 0.60mm/px · z∈[+730,+956]mm · 17 of 257 slices shown]
[im 15/257  lung]
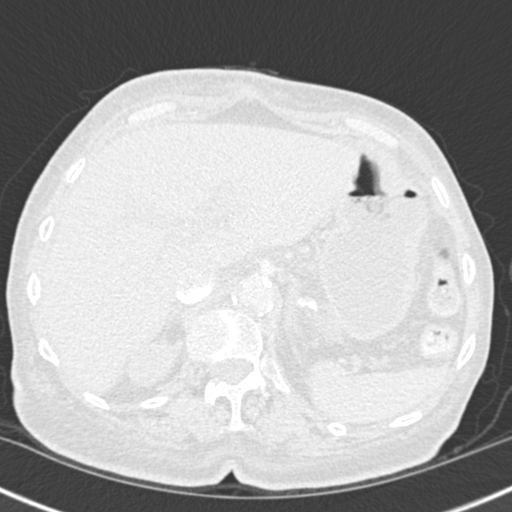
[im 29/257  mediastinal]
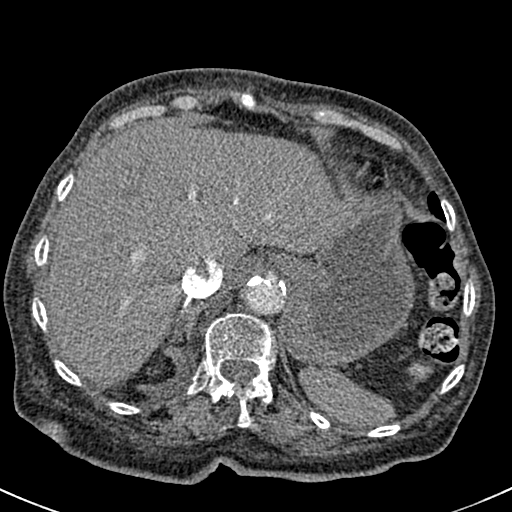
[im 43/257  lung]
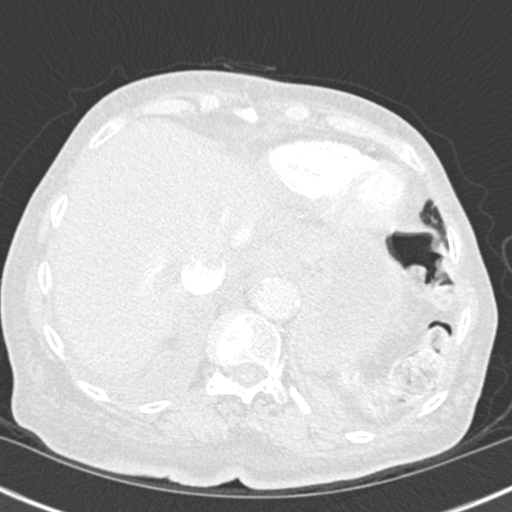
[im 57/257  mediastinal]
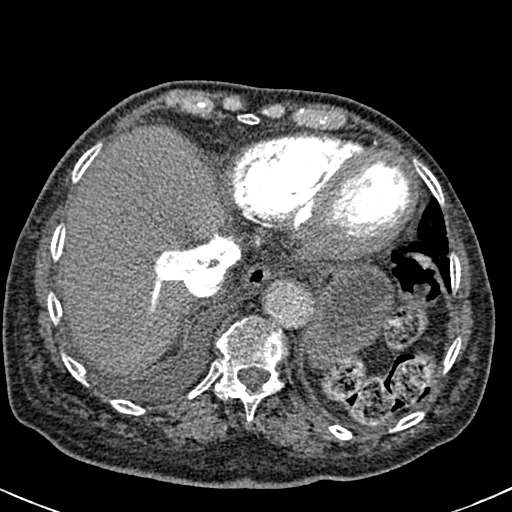
[im 72/257  lung]
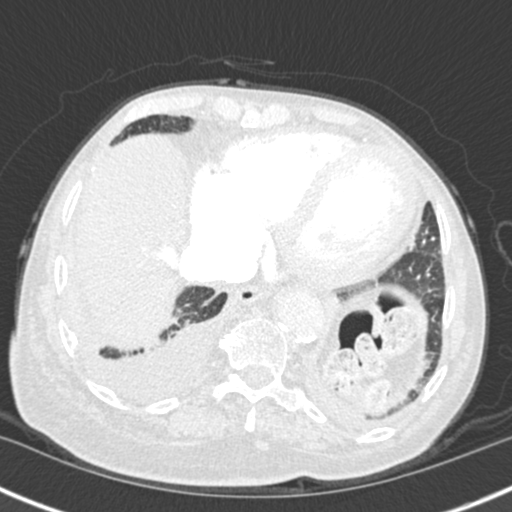
[im 86/257  mediastinal]
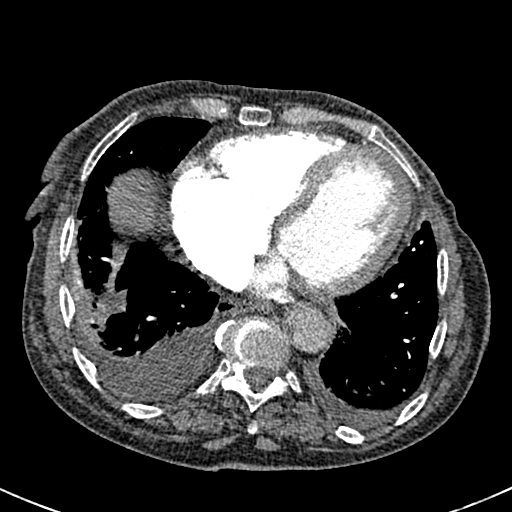
[im 100/257  lung]
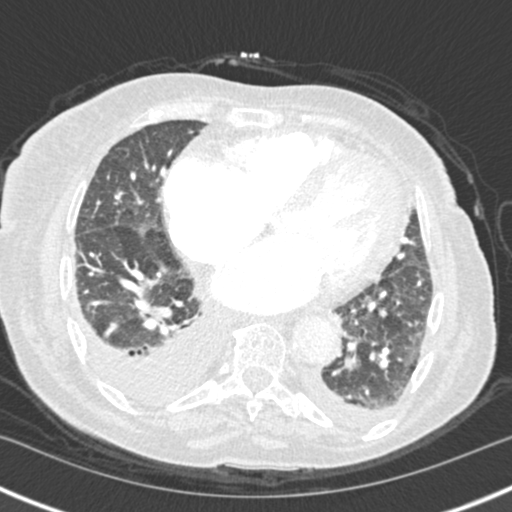
[im 114/257  mediastinal]
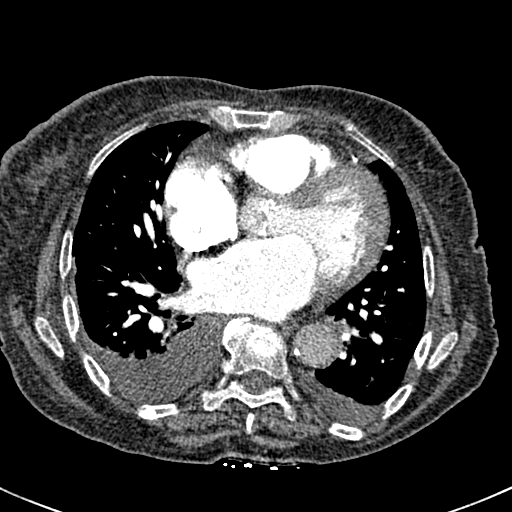
[im 129/257  lung]
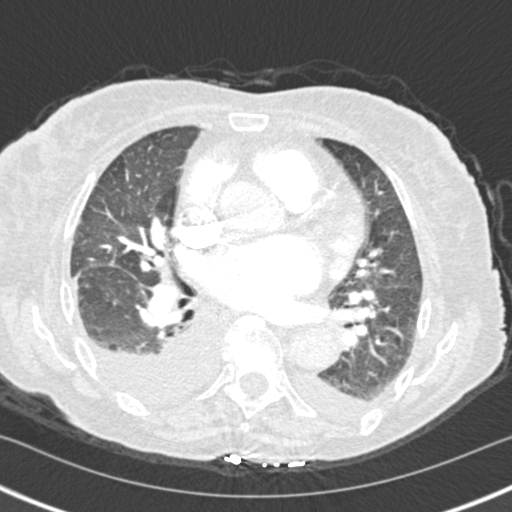
[im 143/257  mediastinal]
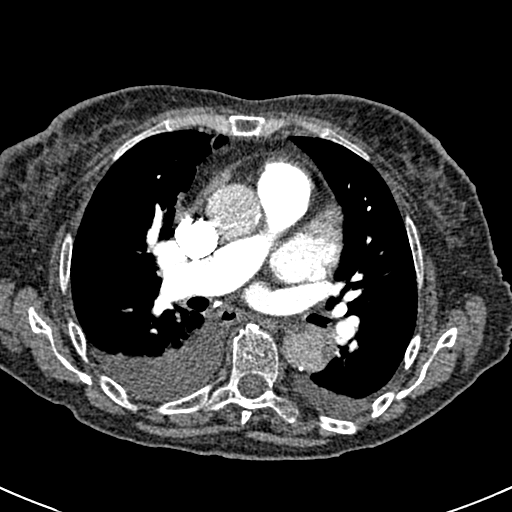
[im 157/257  lung]
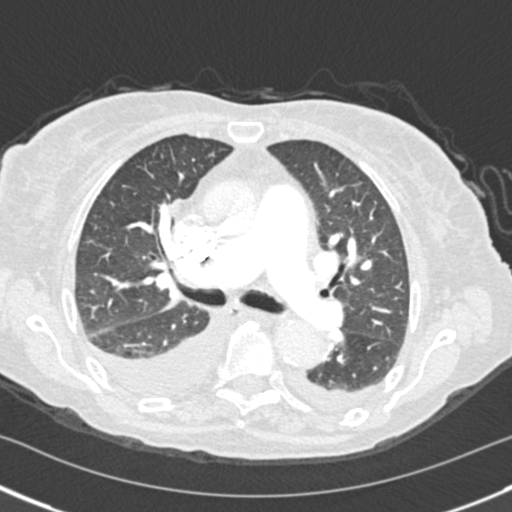
[im 171/257  mediastinal]
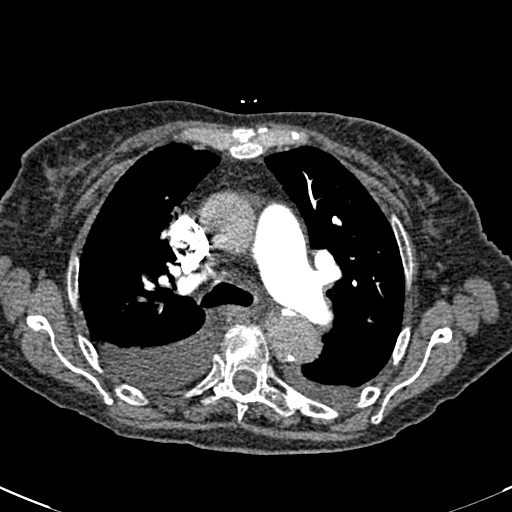
[im 185/257  lung]
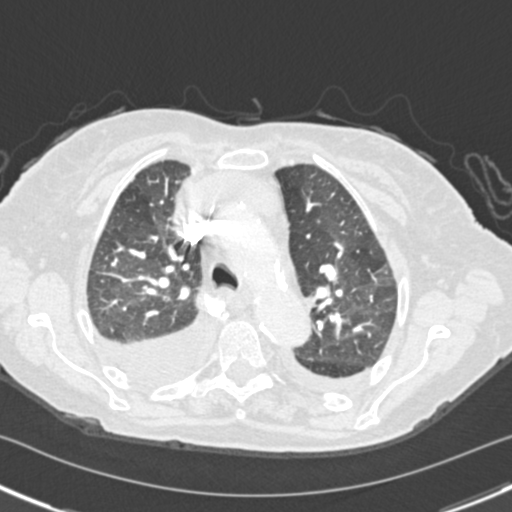
[im 200/257  mediastinal]
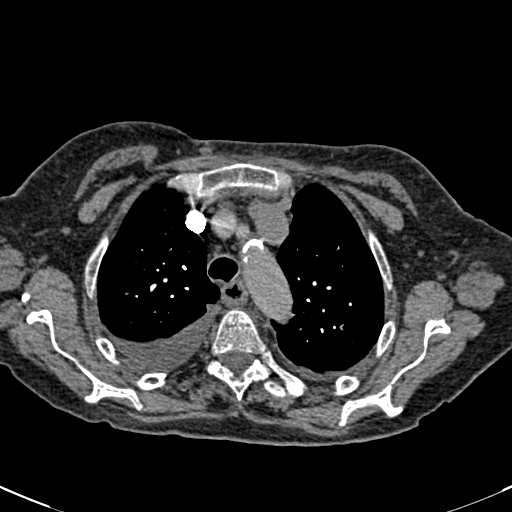
[im 214/257  lung]
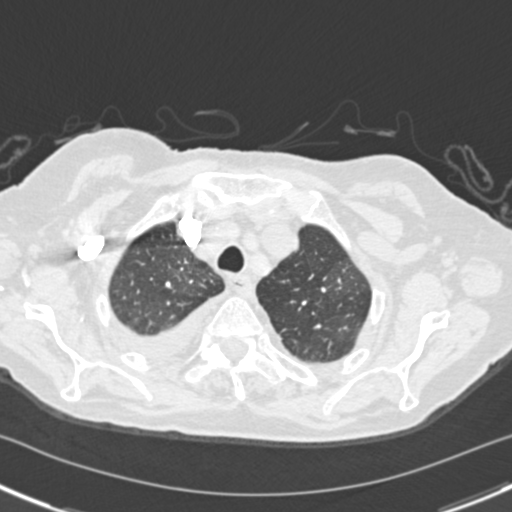
[im 228/257  mediastinal]
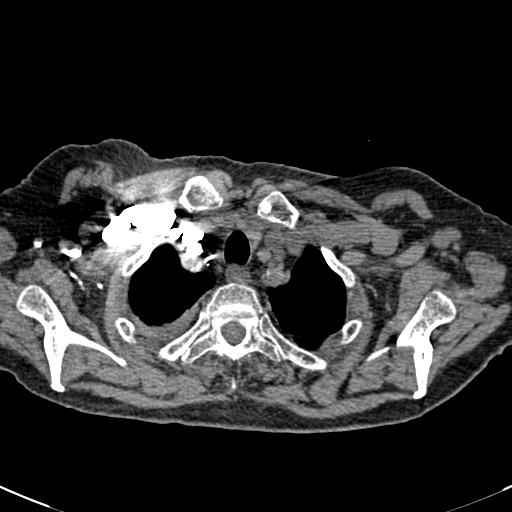
[im 242/257  lung]
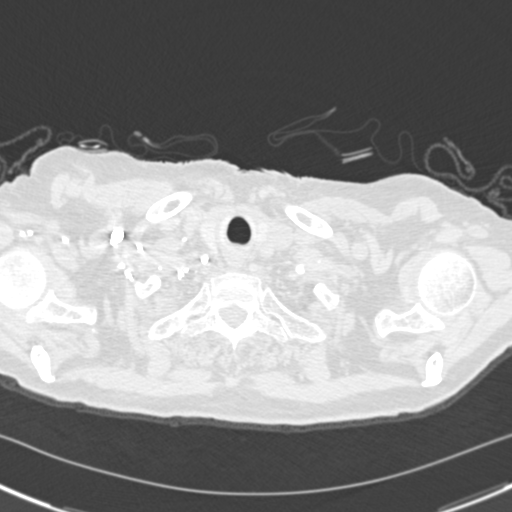

[Series 7: cor pe 2.0 mpr · coronal · 0.51mm/px · 1 of 112 slices shown]
[im 56/112  mediastinal]
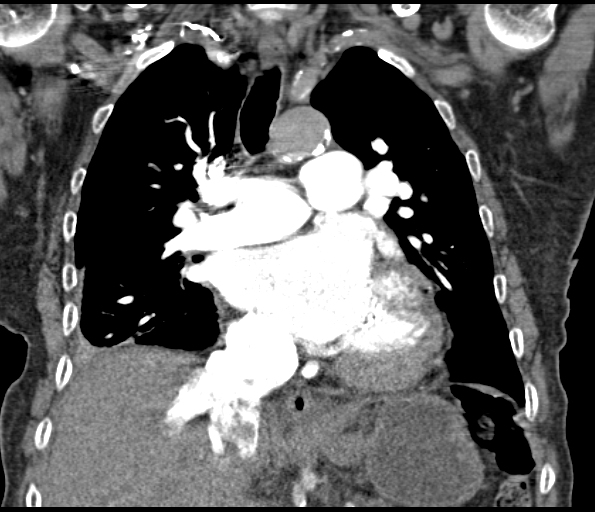

[18 of 36 positions shown; findings below may reference images not displayed]

FINDINGS: There are no pulmonary emboli. There is a moderate right effusion
and a small left effusion. There is cardiomegaly. RV/LV ratio is
normal. No pulmonary edema. Mosaic appearance of the lungs is
suggestive of reactive airways. There are some emphysematous blebs
at the right lung base posteriorly.

No hilar or mediastinal adenopathy.

The visualized portion of the upper abdomen is normal.

Review of the MIP images confirms the above findings.
IMPRESSION: Cardiomegaly with bilateral pleural effusions. No pulmonary
emboli.Slight emphysematous changes at the right lung base.

## 2015-03-10 IMAGING — US ABDOMEN ULTRASOUND LIMITED
1 series · 14 of 25 positions shown · non-contrast
Comparison: CT abdomen and pelvis 07/09/2013

CLINICAL DATA: Abdominal pain.  Abnormal CT scan.

EXAM:
US ABDOMEN LIMITED - RIGHT UPPER QUADRANT

[Series 1: abdomen ultrasound limited · 0.27mm/px · 14 of 81 slices shown]
[im 1/81]
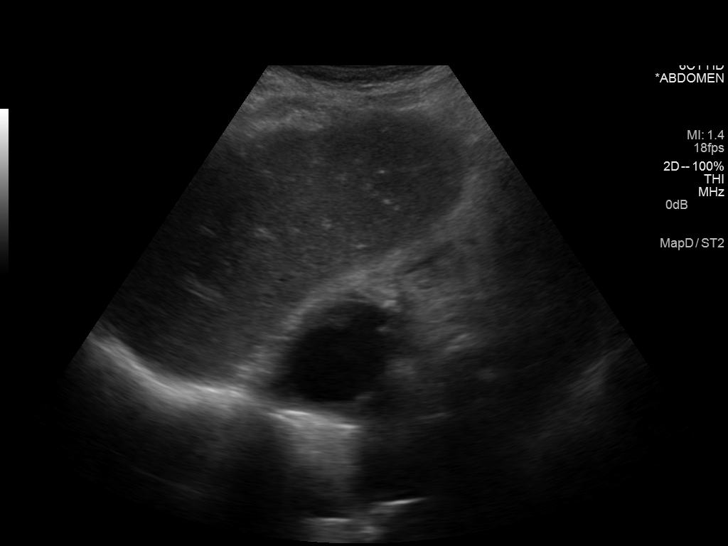
[im 7/81]
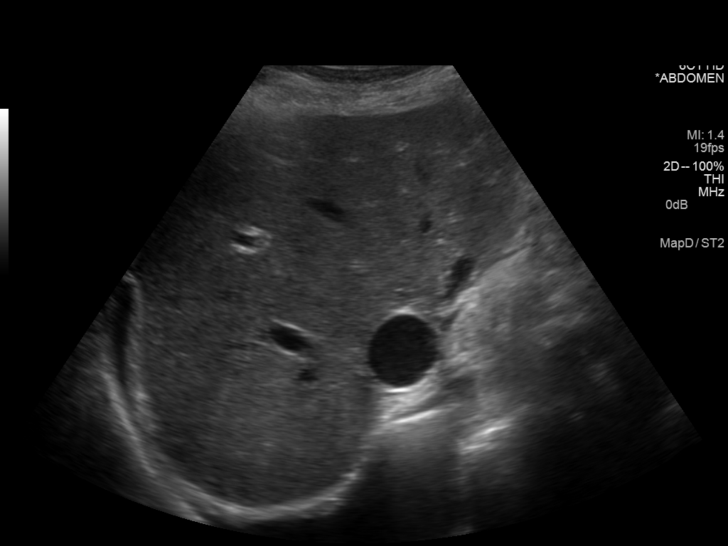
[im 14/81]
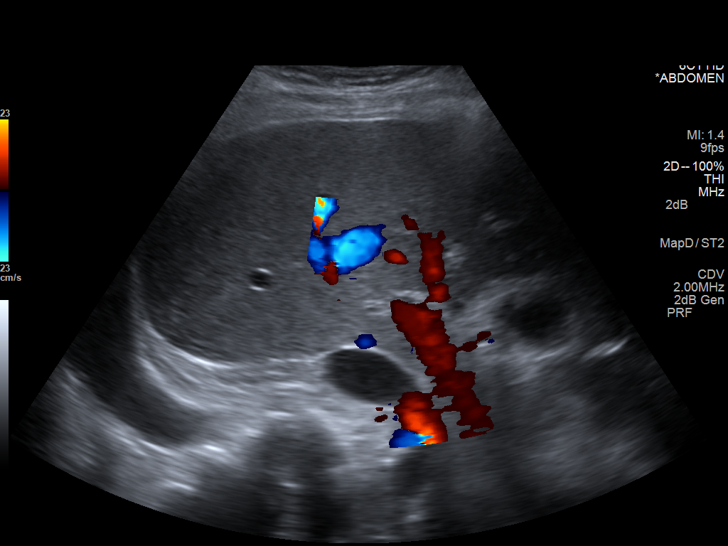
[im 21/81]
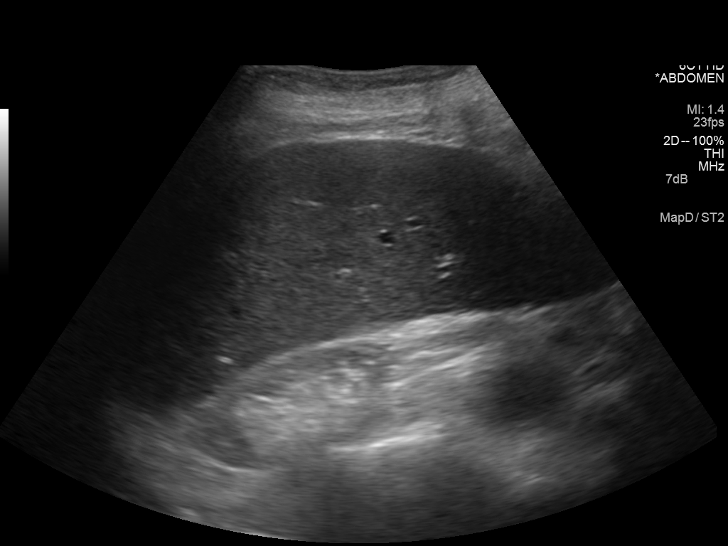
[im 27/81]
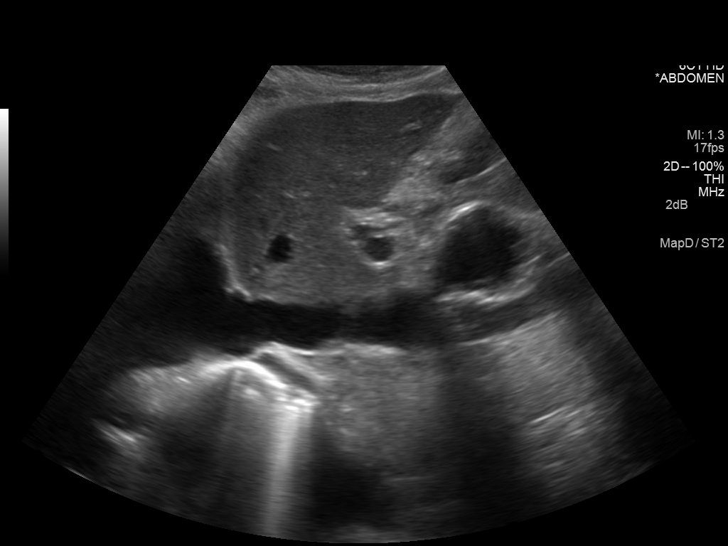
[im 31/81]
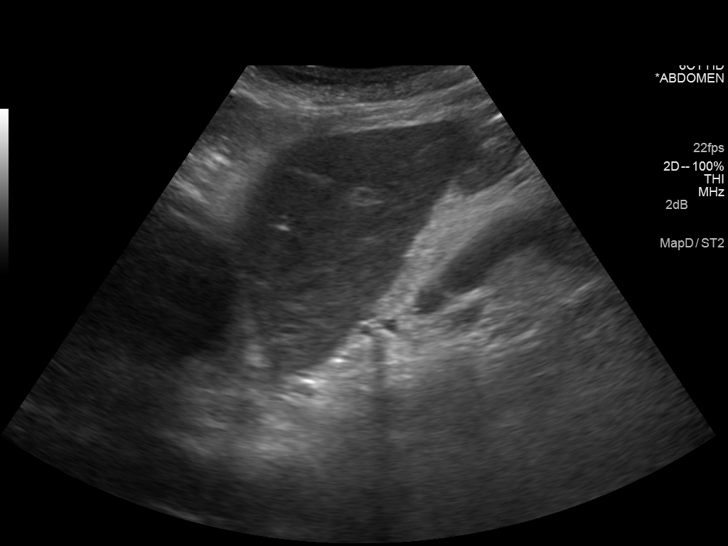
[im 37/81]
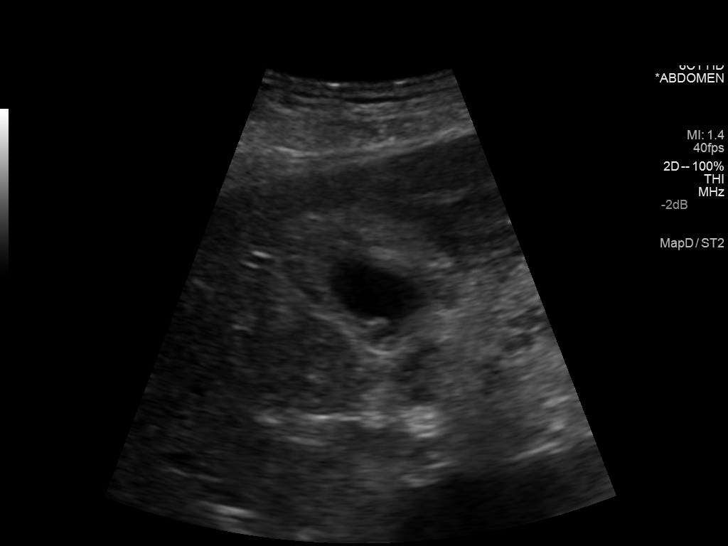
[im 44/81]
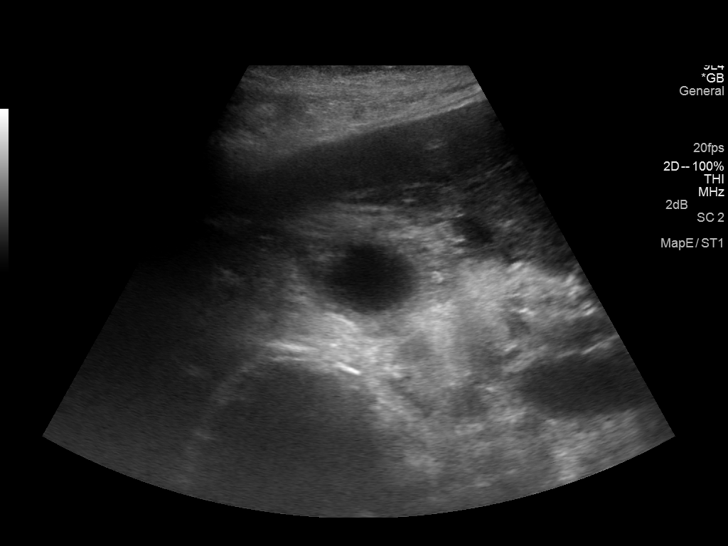
[im 51/81]
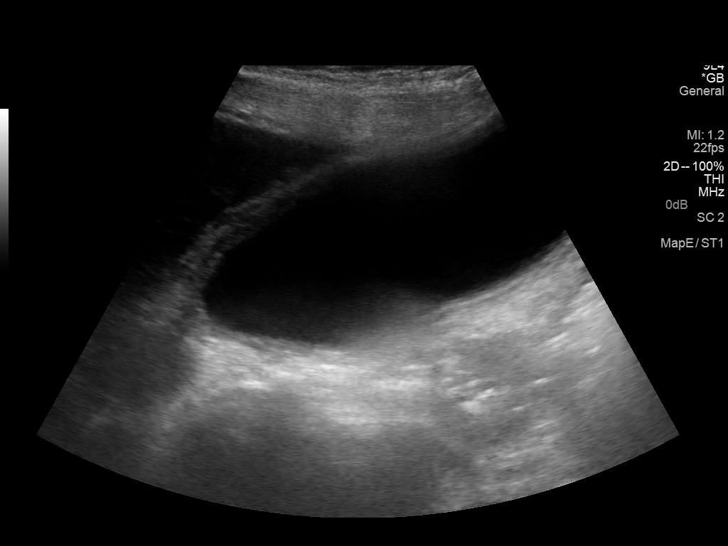
[im 54/81]
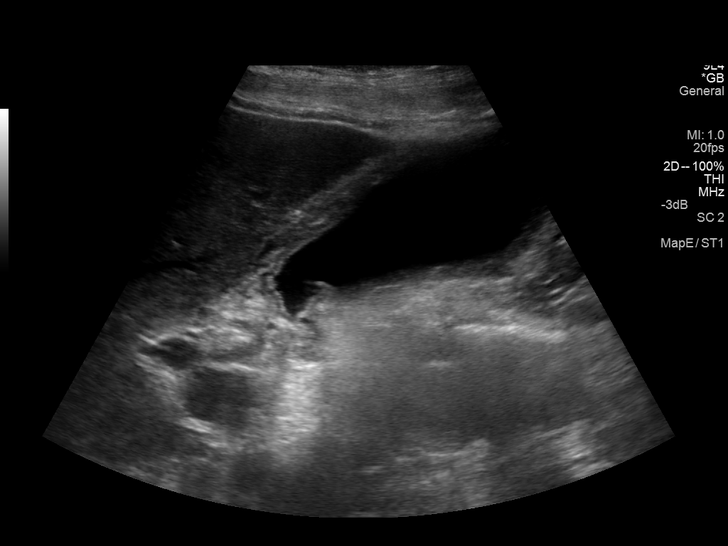
[im 61/81]
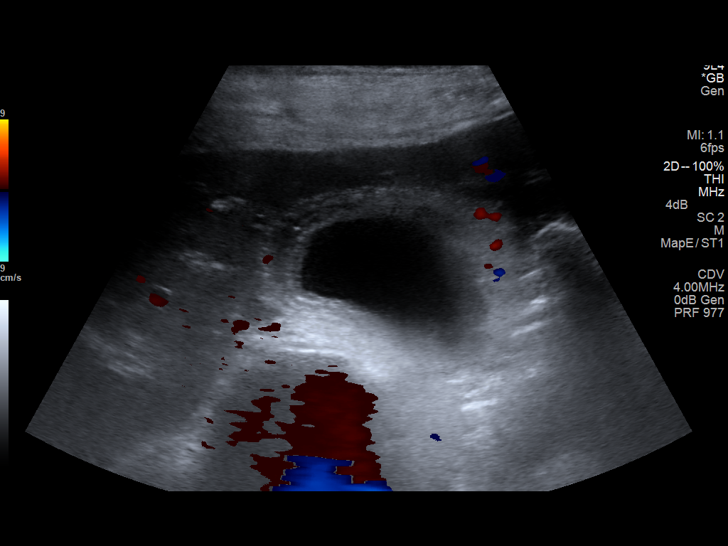
[im 67/81]
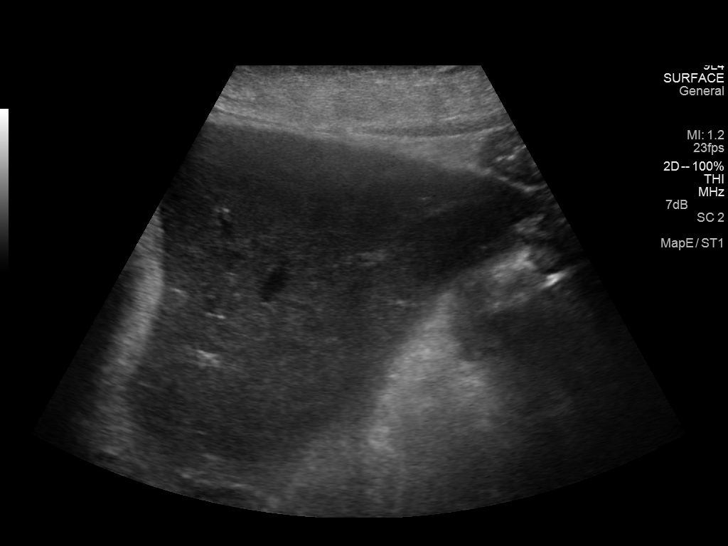
[im 74/81]
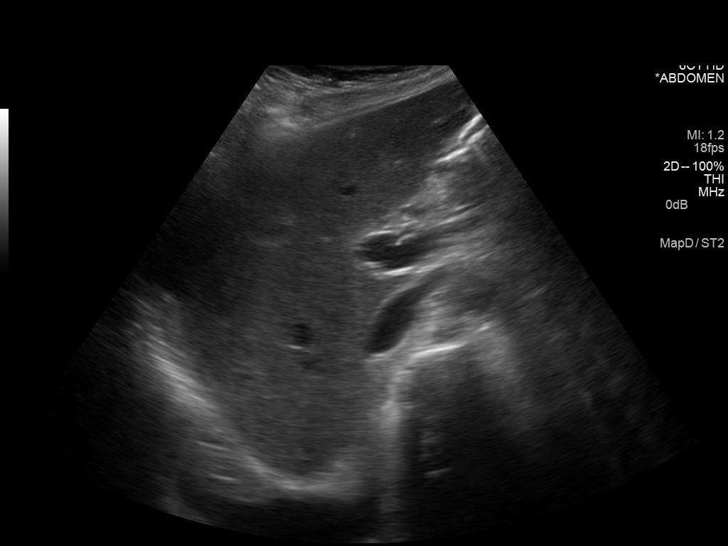
[im 81/81]
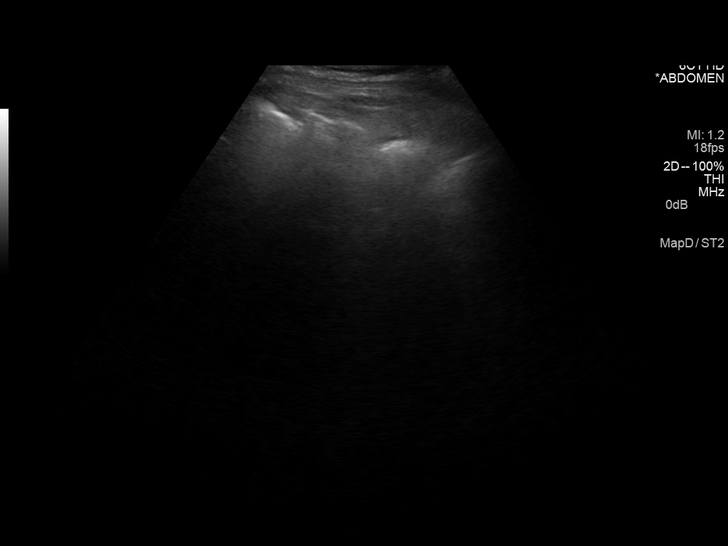

[14 of 25 positions shown; findings below may reference images not displayed]

FINDINGS: Gallbladder:

The gallbladder wall is thickened, measuring up to 6.0 mm. Multiple
mobile gallstones are present. No sonographic Murphy sign is
reported. No air is seen within the gallbladder or gallbladder wall.

Common bile duct:

Diameter: 2.2 mm, within normal limits.

Liver:

No focal lesion identified. Within normal limits in parenchymal
echogenicity. Pneumobilia seen on CT is not identified.

Note is made of some fluid adjacent to the gallbladder. A right
pleural effusion is noted as well.
IMPRESSION: 1. Gallbladder wall thickening and multiple stones is concerning for
cholecystitis despite the absence of a sonographic Murphy sign.
2. Previously seen at pneumobilia is not identified.
3. A small amount of ascites is present.
4. Small right pleural effusion.

## 2015-03-10 IMAGING — CR DG CHEST 1V PORT
1 series · 1 of 1 positions shown · non-contrast
Comparison: CT ANGIO CHEST dated 07/15/2013; DG RIBS 2V*L* dated
07/09/2013; DG CHEST 1V PORT dated 01/29/2013

CLINICAL DATA: Nausea vomiting diarrhea chest pain 1 week

EXAM:
PORTABLE CHEST - 1 VIEW

[ap]
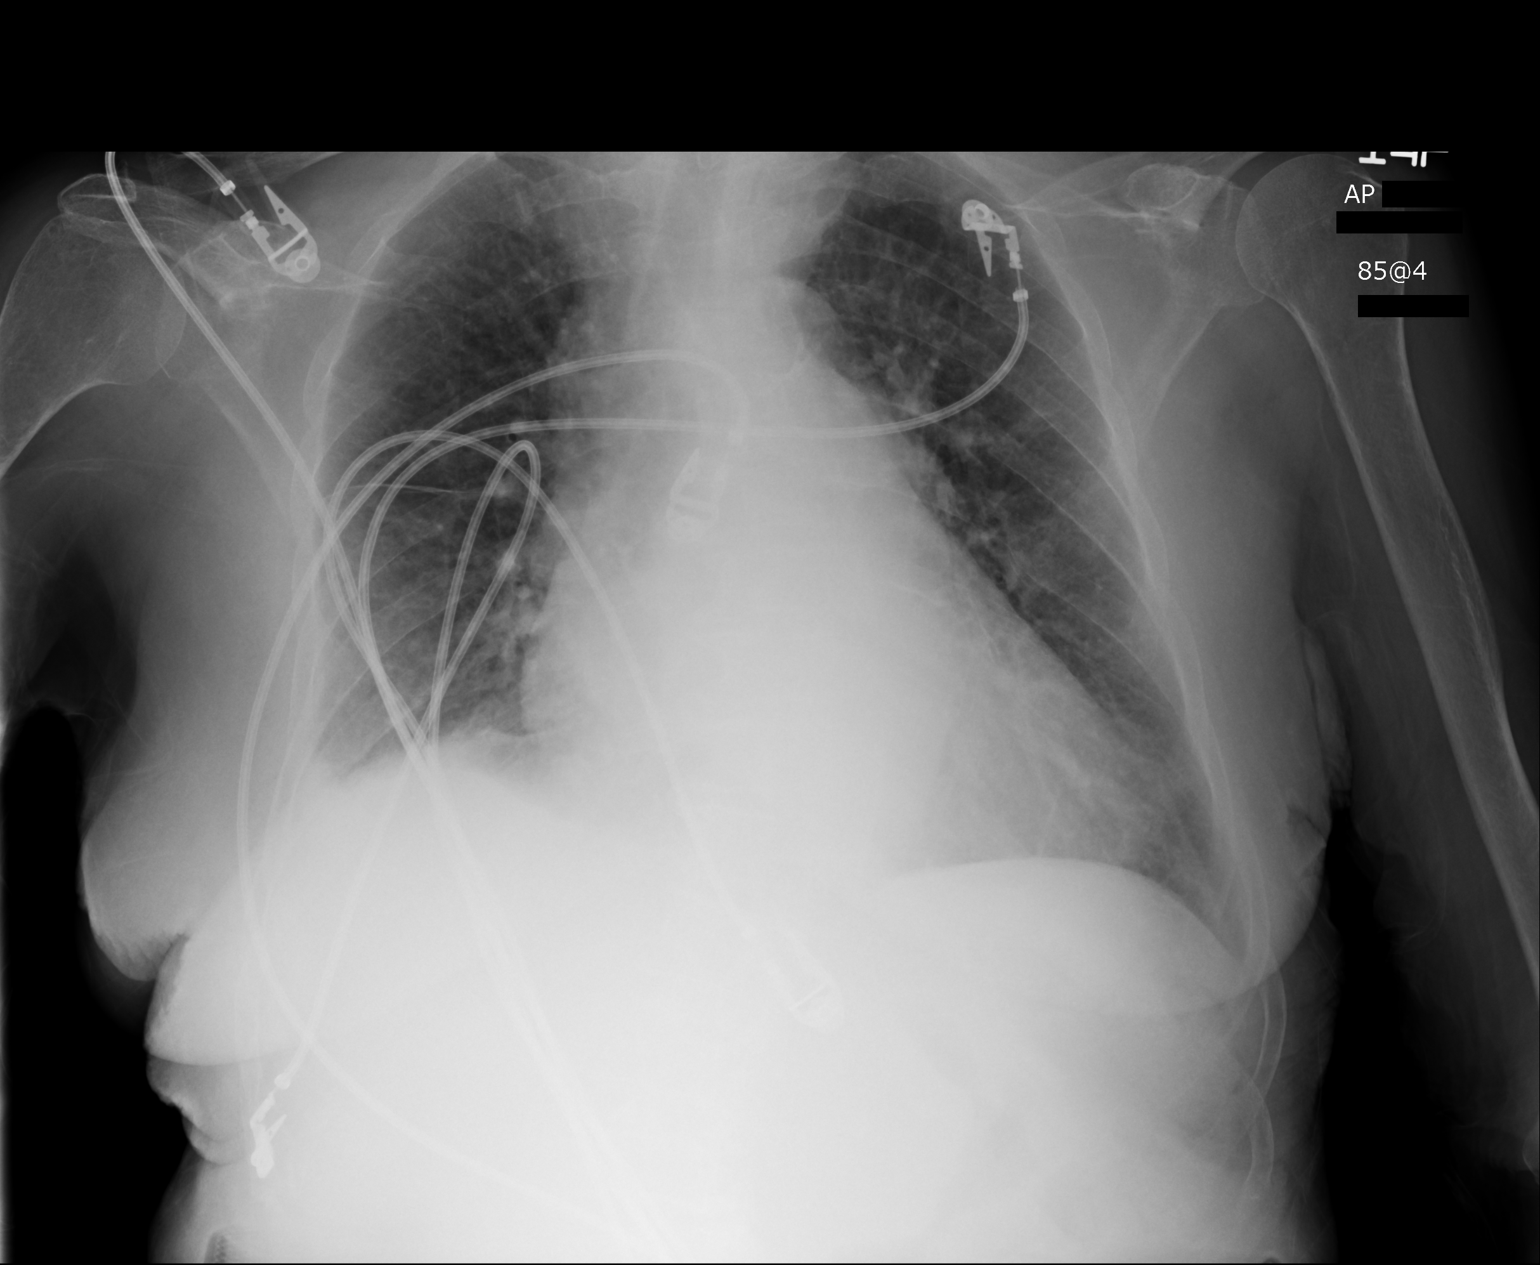

[1 of 1 positions shown; findings below may reference images not displayed]

FINDINGS: Moderate cardiac enlargement. Mild vascular congestion. Mild
interstitial prominence but no definite pulmonary edema. No
consolidation or significant effusion (possible tiny right effusion
noted).
IMPRESSION: Cardiac enlargement and vascular congestion similar to prior
studies.

## 2016-04-11 DEATH — deceased

## 2016-04-17 ENCOUNTER — Ambulatory Visit (INDEPENDENT_AMBULATORY_CARE_PROVIDER_SITE_OTHER): Payer: Self-pay | Admitting: Vascular Surgery

## 2016-04-17 ENCOUNTER — Encounter (INDEPENDENT_AMBULATORY_CARE_PROVIDER_SITE_OTHER): Payer: Self-pay

## 2016-05-15 ENCOUNTER — Ambulatory Visit (INDEPENDENT_AMBULATORY_CARE_PROVIDER_SITE_OTHER): Payer: Self-pay | Admitting: Vascular Surgery

## 2016-05-15 ENCOUNTER — Encounter (INDEPENDENT_AMBULATORY_CARE_PROVIDER_SITE_OTHER): Payer: Self-pay
